# Patient Record
Sex: Male | Born: 1950 | Race: White | Hispanic: No | Marital: Married | Smoking: Former smoker
Health system: Southern US, Community
[De-identification: ages and names within clinical notes are randomized; demographics above are authoritative.]

## PROBLEM LIST (undated history)

## (undated) DIAGNOSIS — I10 Essential (primary) hypertension: Secondary | ICD-10-CM

## (undated) DIAGNOSIS — R3915 Urgency of urination: Secondary | ICD-10-CM

## (undated) DIAGNOSIS — Z9889 Other specified postprocedural states: Secondary | ICD-10-CM

## (undated) DIAGNOSIS — R112 Nausea with vomiting, unspecified: Secondary | ICD-10-CM

## (undated) DIAGNOSIS — N400 Enlarged prostate without lower urinary tract symptoms: Secondary | ICD-10-CM

## (undated) DIAGNOSIS — Z87442 Personal history of urinary calculi: Secondary | ICD-10-CM

## (undated) DIAGNOSIS — M1990 Unspecified osteoarthritis, unspecified site: Secondary | ICD-10-CM

## (undated) DIAGNOSIS — M5126 Other intervertebral disc displacement, lumbar region: Secondary | ICD-10-CM

## (undated) DIAGNOSIS — Z85828 Personal history of other malignant neoplasm of skin: Secondary | ICD-10-CM

## (undated) DIAGNOSIS — I770 Arteriovenous fistula, acquired: Secondary | ICD-10-CM

## (undated) DIAGNOSIS — C801 Malignant (primary) neoplasm, unspecified: Secondary | ICD-10-CM

## (undated) DIAGNOSIS — R002 Palpitations: Secondary | ICD-10-CM

## (undated) DIAGNOSIS — D126 Benign neoplasm of colon, unspecified: Secondary | ICD-10-CM

## (undated) DIAGNOSIS — R011 Cardiac murmur, unspecified: Secondary | ICD-10-CM

## (undated) HISTORY — PX: COLONOSCOPY W/ BIOPSIES: SHX1374

## (undated) HISTORY — PX: PROSTATECTOMY: SHX69

## (undated) HISTORY — DX: Malignant (primary) neoplasm, unspecified: C80.1

## (undated) HISTORY — DX: Arteriovenous fistula, acquired: I77.0

## (undated) HISTORY — DX: Essential (primary) hypertension: I10

## (undated) HISTORY — PX: OTHER SURGICAL HISTORY: SHX169

## (undated) HISTORY — DX: Benign neoplasm of colon, unspecified: D12.6

## (undated) HISTORY — DX: Other intervertebral disc displacement, lumbar region: M51.26

## (undated) HISTORY — DX: Benign prostatic hyperplasia without lower urinary tract symptoms: N40.0

## (undated) HISTORY — PX: POLYPECTOMY: SHX149

---

## 1998-10-05 ENCOUNTER — Encounter: Payer: Self-pay | Admitting: Emergency Medicine

## 1998-10-05 ENCOUNTER — Emergency Department (HOSPITAL_COMMUNITY): Admission: EM | Admit: 1998-10-05 | Discharge: 1998-10-05 | Payer: Self-pay | Admitting: Emergency Medicine

## 2003-05-12 ENCOUNTER — Encounter: Payer: Self-pay | Admitting: Vascular Surgery

## 2003-05-12 ENCOUNTER — Ambulatory Visit (HOSPITAL_COMMUNITY): Admission: RE | Admit: 2003-05-12 | Discharge: 2003-05-12 | Payer: Self-pay | Admitting: Vascular Surgery

## 2003-05-17 ENCOUNTER — Ambulatory Visit (HOSPITAL_COMMUNITY): Admission: RE | Admit: 2003-05-17 | Discharge: 2003-05-17 | Payer: Self-pay | Admitting: Vascular Surgery

## 2003-05-19 DIAGNOSIS — I770 Arteriovenous fistula, acquired: Secondary | ICD-10-CM

## 2003-05-19 HISTORY — DX: Arteriovenous fistula, acquired: I77.0

## 2003-06-15 ENCOUNTER — Ambulatory Visit (HOSPITAL_COMMUNITY): Admission: RE | Admit: 2003-06-15 | Discharge: 2003-06-16 | Payer: Self-pay | Admitting: Interventional Radiology

## 2003-06-19 ENCOUNTER — Emergency Department (HOSPITAL_COMMUNITY): Admission: EM | Admit: 2003-06-19 | Discharge: 2003-06-19 | Payer: Self-pay | Admitting: Emergency Medicine

## 2003-07-04 ENCOUNTER — Ambulatory Visit (HOSPITAL_COMMUNITY): Admission: RE | Admit: 2003-07-04 | Discharge: 2003-07-04 | Payer: Self-pay | Admitting: Vascular Surgery

## 2003-08-19 HISTORY — PX: AV FISTULA REPAIR: SHX563

## 2003-10-16 ENCOUNTER — Ambulatory Visit (HOSPITAL_COMMUNITY): Admission: RE | Admit: 2003-10-16 | Discharge: 2003-10-16 | Payer: Self-pay | Admitting: Interventional Radiology

## 2004-06-04 ENCOUNTER — Ambulatory Visit (HOSPITAL_COMMUNITY): Admission: RE | Admit: 2004-06-04 | Discharge: 2004-06-04 | Payer: Self-pay | Admitting: Interventional Radiology

## 2004-06-06 ENCOUNTER — Ambulatory Visit (HOSPITAL_COMMUNITY): Admission: RE | Admit: 2004-06-06 | Discharge: 2004-06-06 | Payer: Self-pay | Admitting: Interventional Radiology

## 2005-08-01 ENCOUNTER — Encounter: Admission: RE | Admit: 2005-08-01 | Discharge: 2005-08-01 | Payer: Self-pay | Admitting: Vascular Surgery

## 2005-08-29 ENCOUNTER — Ambulatory Visit (HOSPITAL_COMMUNITY): Admission: RE | Admit: 2005-08-29 | Discharge: 2005-08-29 | Payer: Self-pay | Admitting: Interventional Radiology

## 2007-08-05 ENCOUNTER — Encounter: Admission: RE | Admit: 2007-08-05 | Discharge: 2007-08-05 | Payer: Self-pay | Admitting: Vascular Surgery

## 2007-08-06 ENCOUNTER — Ambulatory Visit: Payer: Self-pay | Admitting: Vascular Surgery

## 2010-09-07 ENCOUNTER — Encounter: Payer: Self-pay | Admitting: Vascular Surgery

## 2010-12-31 NOTE — Assessment & Plan Note (Signed)
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  This was incidentally  discovered at the time of a femoral arterial injection for a  percutaneous closure device at the time of arteriogram.  This was in  2004 and he subsequently had MRI of his pelvis in 2004, 2006, and today.  The patient continues to be totally asymptomatic with this incidental  finding.  He specifically has no congestive heart failure symptoms.  He  had no leg swelling.  He does have slight telangiectasia in both legs  with no evidence of varicosities or leg swelling.  He otherwise is in  excellent health.   PHYSICAL EXAM:  I do not appreciate an abdominal bruit.  He has 2+  femoral pulses bilaterally and does not have any swelling in his right  versus left leg.   I reviewed his MRI with him today.  This shows no change.  Maximal  diameter of approximately 5-1/2 to 6 cm with communication between the  right internal carotid artery and veins.  I have again discussed this  with the patient and his wife present.  I explained I am comfortable  with no further followup since we have imaged this on 3 occasions now  with no change.  I explained symptoms of progressive problems with AV  malformations, which I explained would be extremely unlikely.  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  He is comfortable with this and will be seen on an as needed  basis.   Larina Earthly, M.D.  Electronically Signed   TFE/MEDQ  D:  08/06/2007  T:  08/09/2007  Job:  [id number]   cc:   Gloriajean Dell. Andrey Campanile, M.D.

## 2014-08-03 ENCOUNTER — Encounter: Payer: Self-pay | Admitting: Internal Medicine

## 2014-09-12 ENCOUNTER — Ambulatory Visit (AMBULATORY_SURGERY_CENTER): Payer: Self-pay

## 2014-09-12 VITALS — Ht 69.0 in | Wt 186.6 lb

## 2014-09-12 DIAGNOSIS — Z8371 Family history of colonic polyps: Secondary | ICD-10-CM

## 2014-09-12 MED ORDER — MOVIPREP 100 G PO SOLR: ORAL | Status: DC

## 2014-09-12 NOTE — Progress Notes (Signed)
Per pt, no allergies to soy or egg products.Pt not taking any weight loss meds or using  O2 at home. 

## 2014-09-22 ENCOUNTER — Encounter: Payer: Self-pay | Admitting: Internal Medicine

## 2014-09-22 ENCOUNTER — Other Ambulatory Visit: Payer: Self-pay | Admitting: *Deleted

## 2014-09-22 ENCOUNTER — Ambulatory Visit (AMBULATORY_SURGERY_CENTER): Payer: 59 | Admitting: Internal Medicine

## 2014-09-22 ENCOUNTER — Ambulatory Visit (INDEPENDENT_AMBULATORY_CARE_PROVIDER_SITE_OTHER)
Admission: RE | Admit: 2014-09-22 | Discharge: 2014-09-22 | Disposition: A | Discharge status: Home / Self Care | Payer: 59 | Source: Ambulatory Visit | Attending: Internal Medicine | Admitting: Internal Medicine

## 2014-09-22 VITALS — BP 135/88 | HR 67 | Temp 98.1°F | Resp 16 | Ht 69.0 in | Wt 186.0 lb

## 2014-09-22 DIAGNOSIS — Z1211 Encounter for screening for malignant neoplasm of colon: Secondary | ICD-10-CM

## 2014-09-22 DIAGNOSIS — Z83719 Family history of colon polyps, unspecified: Secondary | ICD-10-CM

## 2014-09-22 DIAGNOSIS — D120 Benign neoplasm of cecum: Secondary | ICD-10-CM

## 2014-09-22 DIAGNOSIS — D122 Benign neoplasm of ascending colon: Secondary | ICD-10-CM

## 2014-09-22 DIAGNOSIS — Z8371 Family history of colonic polyps: Secondary | ICD-10-CM

## 2014-09-22 HISTORY — PX: COLONOSCOPY: SHX174

## 2014-09-22 MED ORDER — SODIUM CHLORIDE 0.9 % IV SOLN: 500.0000 mL | INTRAVENOUS | Status: DC

## 2014-09-22 NOTE — Patient Instructions (Signed)
Discharge instructions given. Handouts on polyps and diverticulosis given. KUB ordered for today.return office visit in a few weeks office will schedule. Resume previous medications. YOU HAD AN ENDOSCOPIC PROCEDURE TODAY AT [facility]: Refer to the procedure report that was given to you for any specific questions about what was found during the examination.  If the procedure report does not answer your questions, please call your gastroenterologist to clarify.  If you requested that your care partner not be given the details of your procedure findings, then the procedure report has been included in a sealed envelope for you to review at your convenience later.  YOU SHOULD EXPECT: Some feelings of bloating in the abdomen. Passage of more gas than usual.  Walking can help get rid of the air that was put into your GI tract during the procedure and reduce the bloating. If you had a lower endoscopy (such as a colonoscopy or flexible sigmoidoscopy) you may notice spotting of blood in your stool or on the toilet paper. If you underwent a bowel prep for your procedure, then you may not have a normal bowel movement for a few days.  DIET: Your first meal following the procedure should be a light meal and then it is ok to progress to your normal diet.  A half-sandwich or bowl of soup is an example of a good first meal.  Heavy or fried foods are harder to digest and may make you feel nauseous or bloated.  Likewise meals heavy in dairy and vegetables can cause extra gas to form and this can also increase the bloating.  Drink plenty of fluids but you should avoid alcoholic beverages for 24 hours.  ACTIVITY: Your care partner should take you home directly after the procedure.  You should plan to take it easy, moving slowly for the rest of the day.  You can resume normal activity the day after the procedure however you should NOT DRIVE or use heavy machinery for 24 hours (because of the sedation  medicines used during the test).    SYMPTOMS TO REPORT IMMEDIATELY: A gastroenterologist can be reached at any hour.  During normal business hours, 8:30 AM to 5:00 PM Monday through Friday, call [phone].  After hours and on weekends, please call the GI answering service at [phone] who will take a message and have the physician on call contact you.   Following lower endoscopy (colonoscopy or flexible sigmoidoscopy):  Excessive amounts of blood in the stool  Significant tenderness or worsening of abdominal pains  Swelling of the abdomen that is new, acute  Fever of 100F or higher FOLLOW UP: If any biopsies were taken you will be contacted by phone or by letter within the next 1-3 weeks.  Call your gastroenterologist if you have not heard about the biopsies in 3 weeks.  Our staff will call the home number listed on your records the next business day following your procedure to check on you and address any questions or concerns that you may have at that time regarding the information given to you following your procedure. This is a courtesy call and so if there is no answer at the home number and we have not heard from you through the emergency physician on call, we will assume that you have returned to your regular daily activities without incident.  SIGNATURES/CONFIDENTIALITY: You and/or your care partner have signed paperwork which will be entered into your electronic medical record.  These signatures attest to the fact  that that the information above on your After Visit Summary has been reviewed and is understood.  Full responsibility of the confidentiality of this discharge information lies with you and/or your care-partner.

## 2014-09-22 NOTE — Progress Notes (Signed)
Called to room to assist during endoscopic procedure.  Patient ID and intended procedure confirmed with present staff. Received instructions for my participation in the procedure from the performing physician.  

## 2014-09-22 NOTE — Progress Notes (Signed)
Report to PACU, RN, vss, BBS= Clear.  

## 2014-09-22 NOTE — Op Note (Addendum)
[place]  [address]. [place], [address]   COLONOSCOPY PROCEDURE REPORT  PATIENT: Gregory Castaneda, Gregory Castaneda  MR#: [id number] BIRTHDATE: 1951/07/31 , 19  yrs. old GENDER: male ENDOSCOPIST: Lafayette Dragon, MD REFERRED TZ:[facility] Redmond Pulling, MD. PROCEDURE DATE:  09/22/2014 PROCEDURE:   Colonoscopy with snare polypectomy, Submucosal injection, any substance, and Colonoscopy with biopsy First Screening Colonoscopy - Avg.  risk and is 50 yrs.  old or older Yes.  Prior Negative Screening - Now for repeat screening. N/A  History of Adenoma - Now for follow-up colonoscopy & has been > or = to 3 yrs.  N/A  Polyps Removed Today? Yes. ASA CLASS:   Class I INDICATIONS:average risk patient for colon cancer and mother had colon polyps. MEDICATIONS: Monitored anesthesia care and Propofol 300 mg IV  DESCRIPTION OF PROCEDURE:   After the risks benefits and alternatives of the procedure were thoroughly explained, informed consent was obtained.  The digital rectal exam revealed no abnormalities of the rectum.   The [id number]  endoscope was introduced through the anus and advanced to the cecum, which was identified by both the appendix and ileocecal valve. No adverse events experienced.   The quality of the prep was good, using MoviPrep  The instrument was then slowly withdrawn as the colon was fully examined.      COLON FINDINGS: Four polypoid shaped sessile polyps measuring from 9 mm to 23 mm in size were found at the cecum x1 ( 9 mm) and ascending colon at 80 cm x3 , the largest polyp at 80 cm  measured 23 mm and it was carpeted and spread out and could not be removed entirely. Multiple biopsies were obtained. Polyp was injected with SPOT tattoo and marked with  surgical clips for location. The polyp appeared to very soft and  was not ulcerated  The remaining polyps in ascending colon polypectomy was performed with a cold snare. The resection was complete, the polyp tissue  was completely retrieved and sent to histology.  Multiple biopsies were performed using cold forceps of the remaining 2 polyps. There were  scattered diverticuli throughout the entire colon .  Retroflexed views revealed no abnormalities. The time to cecum=3 minutes 02 seconds. Withdrawal time=25 minutes 19 seconds.  The scope was withdrawn and the procedure completed. COMPLICATIONS: There were no immediate complications.  ENDOSCOPIC IMPRESSION: Four sessile polyps were found at the cecum x1 and  asscending colon at 80 cmx3,  polypectomy was performed with a cold snare; multiple biopsies of the large carpeted polyp at 80 cm were performed using cold forceps ,the same polyp was tattooed with SPOT  and marked with 2 endoclips, it could not be removed entirely  due to carpeted nature  Mild diverticulosis of entire colon  RECOMMENDATIONS: 1.  Await pathology results 2.  KUB today for location of the endoclips Depending on the pathology we  will consider laparoscopic assisted resection of the carpeted polyp  eSigned:  Lafayette Dragon, MD 10/10/2014 8:22 PM Revised: 10/10/2014 8:22 PM  cc:   PATIENT NAME:  Gregory Castaneda, Gregory Castaneda MR#: [id number]

## 2014-09-25 ENCOUNTER — Telehealth: Payer: Self-pay

## 2014-09-25 NOTE — Telephone Encounter (Signed)
Left message on answering machine. 

## 2014-09-26 ENCOUNTER — Encounter: Payer: Self-pay | Admitting: Internal Medicine

## 2014-10-03 ENCOUNTER — Ambulatory Visit: Payer: 59 | Admitting: Internal Medicine

## 2014-10-04 ENCOUNTER — Encounter: Payer: Self-pay | Admitting: Internal Medicine

## 2014-10-04 NOTE — Progress Notes (Signed)
Patient ID: Gregory Castaneda, male   DOB: [birth year], 64 y.o.   MRN: [id number] The patient's chart has been reviewed by Dr. Olevia Perches  and the recommendations are noted below.      Follow-up advised. Schedule patient for next available appointment. Outcome of communication with the patient: letter mailed

## 2014-10-11 ENCOUNTER — Encounter: Payer: Self-pay | Admitting: Internal Medicine

## 2014-10-11 ENCOUNTER — Ambulatory Visit (INDEPENDENT_AMBULATORY_CARE_PROVIDER_SITE_OTHER): Payer: 59 | Admitting: Internal Medicine

## 2014-10-11 VITALS — BP 150/70 | HR 84 | Ht 66.75 in | Wt 190.4 lb

## 2014-10-11 DIAGNOSIS — D122 Benign neoplasm of ascending colon: Secondary | ICD-10-CM

## 2014-10-11 NOTE — Progress Notes (Signed)
Gregory Castaneda [birth year] [id number]  Note: This dictation was prepared with Dragon digital system. Any transcriptional errors that result from this procedure are unintentional.   History of Present Illness: This is a 64 year old white male who just underwent his first screening colonoscopy on 09/22/2058 year with findings of four polyps.. One polyp at the hepatic flexure was carpeted, large, measuring 2-3 cm in diameter and could not be completely snared because of the of spread out nature. Pathology report showed it to be adenomatous but there was no dysplasia. There was also a cecal polyp which was removed entirely and 2 additional ascending colon polyps which were removed entirely. The post-polypectomy site was tattooed as well as marked with an Endo Clip. KUB was obtained after colonoscopy to document placement of the clip which confirmed to be in hepatic flexure. Patient and his wife came today to discuss future disposition concerning the polyp. Patient's medical history is significant for  embolization of a cerebral av fistula  by Dr. Estanislado Pandy  In 2004. He was also at that time told that he may have a fistula in the right femoral artery. Otherwise he has been in excellent health    Past Medical History  Diagnosis Date  . Lumbar herniated disc   . Hypertension   . Enlarged prostate   . 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 Postoperative nausea and vomiting   . Adenomatous colon polyp     tubular    Past Surgical History  Procedure Laterality Date  . Av fistula repair    . Moles removed      from face    Allergies  Allergen Reactions  . Penicillins     Rash, feet itching, swelling of mouth    Family history and social history have been reviewed.  Review of Systems: Denies abdominal pain weight loss rectal bleeding or change in bowel habits.  The remainder of the 10 point ROS is negative except as outlined in the H&P  Physical Exam: General Appearance Well developed, in  no distress Eyes  Non icteric  HEENT  Non traumatic, normocephalic  Mouth No lesion, tongue papillated, no cheilosis Neck Supple without adenopathy, thyroid not enlarged, no carotid bruits, no JVD Lungs Clear to auscultation bilaterally COR Normal S1, normal S2, regular rhythm, no murmur, quiet precordium Abdomen soft nontender with normoactive bowel sounds. No palpable mass. There is a bruit in the right femoral artery Rectal not done Extremities  No pedal edema Skin No lesions Neurological Alert and oriented x 3 Psychological Normal mood and affect  Assessment and Plan:   64 year old white male with the large carpeted polyp in hepatic flexure of his colon documented on recent colonoscopy. Pathology shows it to be adenoma. There was no dysplasia but not an entire polyp was removed therefore, we are  not completely sure. I have suggested to the patient to undergo a laparoscopically assisted segmental colon resection to prevent future colonoscopies and risk of developing colon cancer. He goes along with that and we will set up appointment for him to see Dr. Johney Maine for consultation

## 2014-10-11 NOTE — Patient Instructions (Addendum)
You have been scheduled for an appointment with Dr. Johney Maine at [facility]. Your appointment is on 10/26/14 at 8:45am. Please arrive at 8:15am for registration. Make certain to bring a list of current medications, including any over the counter medications or vitamins. Also bring your co-pay if you have one as well as your insurance cards. [facility] is located at [address]. Should you need to reschedule your appointment, please contact them at [phone].    cc: Dr Graylon Boston, Dr Kathryne Eriksson

## 2014-10-26 ENCOUNTER — Other Ambulatory Visit (INDEPENDENT_AMBULATORY_CARE_PROVIDER_SITE_OTHER): Payer: Self-pay | Admitting: Surgery

## 2014-10-26 NOTE — H&P (Signed)
Gregory Castaneda 10/26/2014 8:40 AM Location: [facility] Patient #: [id number] DOB: November 02, 1950 Married / Language: [name] / Race: White Male History of Present Illness [name] MD; 10/26/2014 9:44 AM) Patient words: colon polyp.  The patient is a 64 year old male who presents with a colonic polyp. Pleasant active male sent by his gastroenterologist, Dr. Delfin Edis, for concern of large hepatic flexure sessile serrated adenomatous polyp. Active male. Former Production designer, theatre/television/film. Mother had history of colon polyps. Underwent screening colonoscopy. Numerous small adenomatous polyps removed. Larger sessile serrated adenomatous polyp found at hepatic flexure of the colon by endoscopy as well as by plain film. Patient normally has one bowel movement a day. Rather physically active. No history of Crohn's or ulcerative colitis. No inflammatory bowel disease. No irritable bowel syndrome. Mild urinary symptoms controlled with Flomax. He's never had abdominal surgeries. Patient concern of large polyp not able to be removed endoscopically, surgical consultation requested. Patient also has a history of arteriovenous malformations. Had one glued shut by endovascular approach. Has a another one near his RIGHT femoral vein that is stable by MRI for the past few years. No problems with walking. No problems with hearing or headaches. He does not hear a thrill in his right ear anymore as he did before Other Problems Marjean Donna, CMA; 10/26/2014 8:40 AM) Back Pain Diverticulosis High blood pressure Other disease, cancer, significant illness  Past Surgical History Marjean Donna, [place]; 10/26/2014 8:40 AM) Colon Polyp Removal - Colonoscopy  Diagnostic Studies History Marjean Donna, CMA; 10/26/2014 8:40 AM) Colonoscopy within last year  Allergies Davy Pique Bynum, CMA; 10/26/2014 8:42 AM) Penicillamine *ASSORTED CLASSES*  Medication History (Sonya Bynum, CMA; 10/26/2014 8:42  AM) Hydrochlorothiazide (25MG  Tablet, Oral) Active. Tamsulosin HCl (0.4MG  Capsule, Oral) Active. Diazepam (5MG  Tablet, Oral) Active. Cyclobenzaprine HCl (10MG  Tablet, Oral) Active. Medications Reconciled  Social History Marjean Donna, CMA; 10/26/2014 8:40 AM) Alcohol use Moderate alcohol use. Caffeine use Coffee. No drug use Tobacco use Former smoker.  Family History Marjean Donna, CMA; 10/26/2014 8:40 AM) Bleeding disorder Mother. Cancer Father. Cerebrovascular Accident Mother. Colon Polyps Mother. Hypertension Mother, Sister. Melanoma Mother, Sister. Migraine Headache Sister. Respiratory Condition Father. Thyroid problems Mother, Sister.     Review of Systems Davy Pique Bynum CMA; 10/26/2014 8:40 AM) General Not Present- Appetite Loss, Chills, Fatigue, Fever, Night Sweats, Weight Gain and Weight Loss. Skin Not Present- Change in Wart/Mole, Dryness, Hives, Jaundice, New Lesions, Non-Healing Wounds, Rash and Ulcer. HEENT Present- Wears glasses/contact lenses. Not Present- Earache, Hearing Loss, Hoarseness, Nose Bleed, Oral Ulcers, Ringing in the Ears, Seasonal Allergies, Sinus Pain, Sore Throat, Visual Disturbances and Yellow Eyes. Respiratory Not Present- Bloody sputum, Chronic Cough, Difficulty Breathing, Snoring and Wheezing. Breast Not Present- Breast Mass, Breast Pain, Nipple Discharge and Skin Changes. Cardiovascular Not Present- Chest Pain, Difficulty Breathing Lying Down, Leg Cramps, Palpitations, Rapid Heart Rate, Shortness of Breath and Swelling of Extremities. Gastrointestinal Not Present- Abdominal Pain, Bloating, Bloody Stool, Change in Bowel Habits, Chronic diarrhea, Constipation, Difficulty Swallowing, Excessive gas, Gets full quickly at meals, Hemorrhoids, Indigestion, Nausea, Rectal Pain and Vomiting. Male Genitourinary Present- Change in Urinary Stream. Not Present- Blood in Urine, Frequency, Impotence, Nocturia, Painful Urination, Urgency and Urine  Leakage. Musculoskeletal Present- Back Pain. Not Present- Joint Pain, Joint Stiffness, Muscle Pain, Muscle Weakness and Swelling of Extremities. Neurological Not Present- Decreased Memory, Fainting, Headaches, Numbness, Seizures, Tingling, Tremor, Trouble walking and Weakness. Psychiatric Not Present- Anxiety, Bipolar, Change in Sleep Pattern, Depression, Fearful and Frequent crying. Endocrine Not Present- Cold  Intolerance, Excessive Hunger, Hair Changes, Heat Intolerance, Hot flashes and New Diabetes. Hematology Present- Easy Bruising. Not Present- Excessive bleeding, Gland problems, HIV and Persistent Infections.  Vitals (Sonya Bynum CMA; 10/26/2014 8:41 AM) 10/26/2014 8:41 AM Weight: 187 lb Height: 67in Body Surface Area: 2 m Body Mass Index: 29.29 kg/m Temp.: 97.31F(Temporal)  Pulse: 77 (Regular)  BP: 126/74 (Sitting, Left Arm, Standard)     Physical Exam [name] MD; 10/26/2014 9:36 AM)  General Mental Status-Alert. General Appearance-Not in acute distress, Not Sickly. Orientation-Oriented X3. Hydration-Well hydrated. Voice-Normal.  Integumentary Global Assessment Upon inspection and palpation of skin surfaces of the - Axillae: non-tender, no inflammation or ulceration, no drainage. and Distribution of scalp and body hair is normal. General Characteristics Temperature - normal warmth is noted.  Head and Neck Head-normocephalic, atraumatic with no lesions or palpable masses. Face Global Assessment - atraumatic, no absence of expression. Neck Global Assessment - no abnormal movements, no bruit auscultated on the right, no bruit auscultated on the left, no decreased range of motion, non-tender. Trachea-midline. Thyroid Gland Characteristics - non-tender.  Eye Eyeball - Left-Extraocular movements intact, No Nystagmus. Eyeball - Right-Extraocular movements intact, No Nystagmus. Cornea - Left-No Hazy. Cornea - Right-No  Hazy. Sclera/Conjunctiva - Left-No scleral icterus, No Discharge. Sclera/Conjunctiva - Right-No scleral icterus, No Discharge. Pupil - Left-Direct reaction to light normal. Pupil - Right-Direct reaction to light normal.  ENMT Ears Pinna - Left - no drainage observed, no generalized tenderness observed. Right - no drainage observed, no generalized tenderness observed. Nose and Sinuses External Inspection of the Nose - no destructive lesion observed. Inspection of the nares - Left - quiet respiration. Right - quiet respiration. Mouth and Throat Lips - Upper Lip - no fissures observed, no pallor noted. Lower Lip - no fissures observed, no pallor noted. Nasopharynx - no discharge present. Oral Cavity/Oropharynx - Tongue - no dryness observed. Oral Mucosa - no cyanosis observed. Hypopharynx - no evidence of airway distress observed.  Chest and Lung Exam Inspection Movements - Normal and Symmetrical. Accessory muscles - No use of accessory muscles in breathing. Palpation Palpation of the chest reveals - Non-tender. Auscultation Breath sounds - Normal and Clear.  Cardiovascular Auscultation Rhythm - Regular. Murmurs & Other Heart Sounds - Auscultation of the heart reveals - No Murmurs and No Systolic Clicks.  Abdomen Inspection Inspection of the abdomen reveals - No Visible peristalsis and No Abnormal pulsations. Umbilicus - No Bleeding, No Urine drainage. Palpation/Percussion Palpation and Percussion of the abdomen reveal - Soft, Non Tender, No Rebound tenderness, No Rigidity (guarding) and No Cutaneous hyperesthesia. Note: Soft and nontender nondistended. Moderate diastases recti. No umbilical hernia.   Male Genitourinary Sexual Maturity Tanner 5 - Adult hair pattern and Adult penile size and shape. Note: Normal external male genitalia. No inguinal hernias.   Peripheral Vascular Upper Extremity Inspection - Left - No Cyanotic nailbeds, Not Ischemic. Right - No Cyanotic  nailbeds, Not Ischemic.  Neurologic Neurologic evaluation reveals -normal attention span and ability to concentrate, able to name objects and repeat phrases. Appropriate fund of knowledge , normal sensation and normal coordination. Mental Status Affect - not angry, not paranoid. Cranial Nerves-Normal Bilaterally. Gait-Normal.  Neuropsychiatric Mental status exam performed with findings of-able to articulate well with normal speech/language, rate, volume and coherence, thought content normal with ability to perform basic computations and apply abstract reasoning and no evidence of hallucinations, delusions, obsessions or homicidal/suicidal ideation.  Musculoskeletal Global Assessment Spine, Ribs and Pelvis - no instability, subluxation or laxity. Right  Upper Extremity - no instability, subluxation or laxity.  Lymphatic Head & Neck  General Head & Neck Lymphatics: Bilateral - Description - No Localized lymphadenopathy. Axillary  General Axillary Region: Bilateral - Description - No Localized lymphadenopathy. Femoral & Inguinal  Generalized Femoral & Inguinal Lymphatics: Left - Description - No Localized lymphadenopathy. Right - Description - No Localized lymphadenopathy.    Assessment & Plan [name] MD; 10/26/2014 9:35 AM)  SESSILE COLONIC POLYP (211.3  K63.5) Impression: Large sessile serrated polyp of ascending colon. Adenomatous changes but no high-grade dysplasia. I gave copy of his colonoscopy report and pathology report.  I do not think this is a great location for endoscopic mucosal resection. Would benefit from segmental colonic resection. Good candidate for minimally invasive approach. Would plan robotic intracorporeal resection and anastomosis. He is interested in proceeding. I discussed with him.  Current Plans Schedule for Surgery Discussed regular exercise with patient.  The anatomy & physiology of the digestive tract was discussed. The  pathophysiology of the colon was discussed. Natural history risks without surgery was discussed. I feel the risks of no intervention will lead to serious problems that outweigh the operative risks; therefore, I recommended a partial colectomy to remove the pathology. Minimally invasive (Robotic/Laparoscopic) & open techniques were discussed.  Risks such as bleeding, infection, abscess, leak, reoperation, possible ostomy, hernia, heart attack, death, and other risks were discussed. I noted a good likelihood this will help address the problem. Goals of post-operative recovery were discussed as well. Need for adequate nutrition, daily bowel regimen and healthy physical activity, to optimize recovery was noted as well. We will work to minimize complications. Educational materials were available as well. Questions were answered. The patient expresses understanding & wishes to proceed with surgery.  Pt Education - [facility] Colon Bowel Prep 2015 Miralax/Antibiotics Started Neomycin Sulfate 500MG , 2 (two) Tablet SEE NOTE, #6, 10/26/2014, No Refill. Local Order: TAKE TWO TABLETS AT 2 PM, 3 PM, AND 10 PM THE DAY PRIOR TO SURGERY Started Flagyl 500MG , 2 (two) Tablet SEE NOTE, #6, 10/26/2014, No Refill. Local Order: Take at 2pm, 3pm, and 10pm the day prior to your colon operation Pt Education - [facility] (Yanira Tolsma) Pt Education - [facility] Abdominal Surgery (Royann Wildasin) Pt Education - [facility] (Charlesetta Milliron)  [name], M.D., F.A.C.S. Gastrointestinal and Minimally Invasive Surgery [facility] [address], [place],  [address] [phone] Main / Paging

## 2014-10-27 NOTE — Progress Notes (Signed)
Richardson Landry, Thanks for seeing this patient. Dora brodie

## 2014-11-17 ENCOUNTER — Encounter (HOSPITAL_COMMUNITY)
Admission: RE | Admit: 2014-11-17 | Discharge: 2014-11-17 | Disposition: A | Discharge status: Home / Self Care | Payer: 59 | Source: Ambulatory Visit | Attending: Surgery | Admitting: Surgery

## 2014-11-17 ENCOUNTER — Encounter (HOSPITAL_COMMUNITY): Payer: Self-pay

## 2014-11-17 ENCOUNTER — Other Ambulatory Visit: Payer: Self-pay

## 2014-11-17 DIAGNOSIS — Z01812 Encounter for preprocedural laboratory examination: Secondary | ICD-10-CM | POA: Insufficient documentation

## 2014-11-17 DIAGNOSIS — Z01810 Encounter for preprocedural cardiovascular examination: Secondary | ICD-10-CM | POA: Diagnosis not present

## 2014-11-17 HISTORY — DX: Other specified postprocedural states: Z98.890

## 2014-11-17 HISTORY — DX: Personal history of urinary calculi: Z87.442

## 2014-11-17 HISTORY — DX: Other specified postprocedural states: R11.2

## 2014-11-17 LAB — BASIC METABOLIC PANEL
Anion gap: 11 (ref 5–15)
BUN: 15 mg/dL (ref 6–23)
CALCIUM: 9.9 mg/dL (ref 8.4–10.5)
CO2: 27 mmol/L (ref 19–32)
Chloride: 104 mmol/L (ref 96–112)
Creatinine, Ser: 1.18 mg/dL (ref 0.50–1.35)
GFR calc Af Amer: 74 mL/min — ABNORMAL LOW (ref >90)
GFR, EST NON AFRICAN AMERICAN: 63 mL/min — AB (ref >90)
GLUCOSE: 114 mg/dL — AB (ref 70–99)
POTASSIUM: 3.6 mmol/L (ref 3.5–5.1)
SODIUM: 142 mmol/L (ref 135–145)

## 2014-11-17 LAB — CBC
HCT: 45 % (ref 39.0–52.0)
Hemoglobin: 15 g/dL (ref 13.0–17.0)
MCH: 30.7 pg (ref 26.0–34.0)
MCHC: 33.3 g/dL (ref 30.0–36.0)
MCV: 92.2 fL (ref 78.0–100.0)
PLATELETS: 194 10*3/uL (ref 150–400)
RBC: 4.88 MIL/uL (ref 4.22–5.81)
RDW: 12.5 % (ref 11.5–15.5)
WBC: 4.3 10*3/uL (ref 4.0–10.5)

## 2014-11-17 NOTE — Progress Notes (Signed)
   11/17/14 1432  OBSTRUCTIVE SLEEP APNEA  Have you ever been diagnosed with sleep apnea through a sleep study? No  Do you snore loudly (loud enough to be heard through closed doors)?  0  Do you often feel tired, fatigued, or sleepy during the daytime? 0  Has anyone observed you stop breathing during your sleep? 0  Do you have, or are you being treated for high blood pressure? 1  BMI more than 35 kg/m2? 0  Age over 64 years old? 1  Neck circumference greater than 40 cm/16 inches? 1  Gender: 1

## 2014-11-17 NOTE — Consult Note (Signed)
WOC ostomy consult note Patient seen for preoperative stoma site selection in the event one is needed intraoperatively.  Patient understands that this is not the operative plan, but should a stoma be required, it is important for it to be located in an ideal site.  Abdomen assessed in the sitting and standing positions. Soft, flaccid abdominal tissues noted, but I am able to palpate the rectus muscle.  Site selected is located in the LUQ, 5.5cm to the left of the umbilicus and 3cm above.  Stoma care will be more challenging is a stoma were to be in the LLQ.  Site is marked with a surgical marking pen but is not covered with a thin film dressing as the planned procedure is not until 11/28/14. Patient understands that there is a Best boy who will assist him with learning the care and management of an ostomy in the event one is created intraoperatively. [facility] nursing team will not follow, but will remain available to this patient, the nursing, surgical and medical teams.  Please re-consult if needed. Thanks, Maudie Flakes, MSN, RN, [place], [place], [place] [phone])

## 2014-11-17 NOTE — Patient Instructions (Addendum)
Your procedure is scheduled on:  11/28/14  TUESDAY  Report to [facility] at   1130    AM.   Call this number if you have problems the morning of surgery: [phone]     BOWEL PREP AS PER OFFICE MAY HAVE CLEAR LIQUIDS ONLY Tuesday MORNING UNTIL 0730am-  THEN NOTHING BY MOUTH    Take these medicines the morning of surgery with A SIP OF WATER:May take Tamulosin   .  Contacts, dentures or partial plates, or metal hairpins  can not be worn to surgery. Your family will be responsible for glasses, dentures, hearing aides while you are in surgery  Leave suitcase in the car. After surgery it may be brought to your room.  For patients admitted to the hospital, checkout time is 11:00 AM day of  discharge.         [facility] IS NOT RESPONSIBLE FOR ANY VALUABLES  Patients discharged the day of surgery will not be allowed to drive home. IF going home the day of surgery, you must have a driver and someone to stay with you for the first 24 hours                                                                  [facility] - Preparing for Surgery Before surgery, you can play an important role.  Because skin is not sterile, your skin needs to be as free of germs as possible.  You can reduce the number of germs on your skin by washing with CHG (chlorahexidine gluconate) soap before surgery.  CHG is an antiseptic cleaner which kills germs and bonds with the skin to continue killing germs even after washing. Please DO NOT use if you have an allergy to CHG or antibacterial soaps.  If your skin becomes reddened/irritated stop using the CHG and inform your nurse when you arrive at Short Stay. Do not shave (including legs and underarms) for at least 48 hours prior to the first CHG shower.  You may shave your face/neck. Please follow these instructions carefully:  1.  Shower with CHG Soap the night before surgery and the  morning of  Surgery.  2.  If you choose to wash your hair, wash your hair first as usual with your  normal  shampoo.  3.  After you shampoo, rinse your hair and body thoroughly to remove the  shampoo.                           4.  Use CHG as you would any other liquid soap.  You can apply chg directly  to the skin and wash                       Gently with a scrungie or clean washcloth.  5.  Apply the CHG Soap to your body ONLY FROM THE NECK DOWN.   Do not use on face/ open                           Wound or open sores. Avoid contact  with eyes, ears mouth and genitals (private parts).                       Wash face,  Genitals (private parts) with your normal soap.             6.  Wash thoroughly, paying special attention to the area where your surgery  will be performed.  7.  Thoroughly rinse your body with warm water from the neck down.  8.  DO NOT shower/wash with your normal soap after using and rinsing off  the CHG Soap.                9.  Pat yourself dry with a clean towel.            10.  Wear clean pajamas.            11.  Place clean sheets on your bed the night of your first shower and do not  sleep with pets. Day of Surgery : Do not apply any lotions/deodorants the morning of surgery.  Please wear clean clothes to the hospital/surgery center.  FAILURE TO FOLLOW THESE INSTRUCTIONS MAY RESULT IN THE CANCELLATION OF YOUR SURGERY PATIENT SIGNATURE_________________________________  NURSE SIGNATURE__________________________________  ________________________________________________________________________  WHAT IS A BLOOD TRANSFUSION? Blood Transfusion Information  A transfusion is the replacement of blood or some of its parts. Blood is made up of multiple cells which provide different functions.  Red blood cells carry oxygen and are used for blood loss replacement.  White blood cells fight against infection.  Platelets control bleeding.  Plasma helps clot blood.  Other blood products are  available for specialized needs, such as hemophilia or other clotting disorders. BEFORE THE TRANSFUSION  Who gives blood for transfusions?   Healthy volunteers who are fully evaluated to make sure their blood is safe. This is blood bank blood. Transfusion therapy is the safest it has ever been in the practice of medicine. Before blood is taken from a donor, a complete history is taken to make sure that person has no history of diseases nor engages in risky social behavior (examples are intravenous drug use or sexual activity with multiple partners). The donor's travel history is screened to minimize risk of transmitting infections, such as malaria. The donated blood is tested for signs of infectious diseases, such as HIV and hepatitis. The blood is then tested to be sure it is compatible with you in order to minimize the chance of a transfusion reaction. If you or a relative donates blood, this is often done in anticipation of surgery and is not appropriate for emergency situations. It takes many days to process the donated blood. RISKS AND COMPLICATIONS Although transfusion therapy is very safe and saves many lives, the main dangers of transfusion include:   Getting an infectious disease.  Developing a transfusion reaction. This is an allergic reaction to something in the blood you were given. Every precaution is taken to prevent this. The decision to have a blood transfusion has been considered carefully by your caregiver before blood is given. Blood is not given unless the benefits outweigh the risks. AFTER THE TRANSFUSION  Right after receiving a blood transfusion, you will usually feel much better and more energetic. This is especially true if your red blood cells have gotten low (anemic). The transfusion raises the level of the red blood cells which carry oxygen, and this usually causes an energy increase.  The nurse administering the transfusion will monitor  you carefully for  complications. HOME CARE INSTRUCTIONS  No special instructions are needed after a transfusion. You may find your energy is better. Speak with your caregiver about any limitations on activity for underlying diseases you may have. SEEK MEDICAL CARE IF:   Your condition is not improving after your transfusion.  You develop redness or irritation at the intravenous (IV) site. SEEK IMMEDIATE MEDICAL CARE IF:  Any of the following symptoms occur over the next 12 hours:  Shaking chills.  You have a temperature by mouth above 102 F (38.9 C), not controlled by medicine.  Chest, back, or muscle pain.  People around you feel you are not acting correctly or are confused.  Shortness of breath or difficulty breathing.  Dizziness and fainting.  You get a rash or develop hives.  You have a decrease in urine output.  Your urine turns a dark color or changes to pink, red, or brown. Any of the following symptoms occur over the next 10 days:  You have a temperature by mouth above 102 F (38.9 C), not controlled by medicine.  Shortness of breath.  Weakness after normal activity.  The white part of the eye turns yellow (jaundice).  You have a decrease in the amount of urine or are urinating less often.  Your urine turns a dark color or changes to pink, red, or brown. Document Released: 08/01/2000 Document Revised: 10/27/2011 Document Reviewed: 03/20/2008 [facility] Patient Information 2014 [place], [place].  _______________________________________________________________________

## 2014-11-17 NOTE — Progress Notes (Signed)
Seen by Darol Destine RN ostomy nurse

## 2014-11-20 LAB — HEMOGLOBIN A1C
HEMOGLOBIN A1C: 5.6 % (ref 4.8–5.6)
Mean Plasma Glucose: 114 mg/dL

## 2014-11-28 ENCOUNTER — Encounter (HOSPITAL_COMMUNITY): Admission: RE | Payer: Self-pay | Source: Ambulatory Visit

## 2014-11-28 ENCOUNTER — Inpatient Hospital Stay (HOSPITAL_COMMUNITY): Admission: RE | Admit: 2014-11-28 | Payer: 59 | Source: Ambulatory Visit | Admitting: Surgery

## 2014-11-28 SURGERY — COLECTOMY, PARTIAL, ROBOT-ASSISTED, LAPAROSCOPIC
Anesthesia: General

## 2015-02-14 NOTE — Progress Notes (Signed)
Please put orders in Epic surgery 02-27-15 pre op 02-23-15 Thanks 

## 2015-02-15 ENCOUNTER — Other Ambulatory Visit: Payer: Self-pay | Admitting: Surgery

## 2015-02-15 NOTE — H&P (Signed)
Gregory Castaneda 10/26/2014 8:40 AM Location: [facility] Patient #: [id number] DOB: [birth year] Married / Language: Gregory Castaneda / Race: White Male  History of Present Illness Gregory Hector Gregory Castaneda; 10/26/2014 9:44 AM) Patient words: colon polyp.  The patient is a 64 year old male who presents with a colonic polyp. Pleasant active male sent by his gastroenterologist, Dr. Delfin Edis, for concern of large hepatic flexure sessile serrated adenomatous polyp. Active male. Former Production designer, theatre/television/film. Mother had history of colon polyps. Underwent screening colonoscopy. Numerous small adenomatous polyps removed. Larger sessile serrated adenomatous polyp found at hepatic flexure of the colon by endoscopy as well as by plain film. Patient normally has one bowel movement a day. Rather physically active. No history of Crohn's or ulcerative colitis. No inflammatory bowel disease. No irritable bowel syndrome. Mild urinary symptoms controlled with Flomax. He's never had abdominal surgeries. Patient concern of large polyp not able to be removed endoscopically, surgical consultation requested. Patient also has a history of arteriovenous malformations. Had one glued shut by endovascular approach. Has a another one near his RIGHT femoral vein that is stable by MRI for the past few years. No problems with walking. No problems with hearing or headaches. He does not hear a thrill in his right ear anymore as he did before.  No new events.  Ready for surgery   Other Problems Gregory Castaneda, Gregory Castaneda; 10/26/2014 8:40 AM) Back Pain Diverticulosis High blood pressure Other disease, cancer, significant illness  Past Surgical History Gregory Castaneda, Gregory Castaneda; 10/26/2014 8:40 AM) Colon Polyp Removal - Colonoscopy  Diagnostic Studies History Gregory Castaneda, Gregory Castaneda; 10/26/2014 8:40 AM) Colonoscopy within last year  Allergies Gregory Castaneda, Gregory Castaneda; 10/26/2014 8:42 AM) Penicillamine *ASSORTED CLASSES*  Medication History  (Gregory Castaneda, Gregory Castaneda; 10/26/2014 8:42 AM) Hydrochlorothiazide (25MG  Tablet, Oral) Active. Tamsulosin HCl (0.4MG  Capsule, Oral) Active. Diazepam (5MG  Tablet, Oral) Active. Cyclobenzaprine HCl (10MG  Tablet, Oral) Active. Medications Reconciled  Social History Gregory Castaneda, Gregory Castaneda; 10/26/2014 8:40 AM) Alcohol use Moderate alcohol use. Caffeine use Coffee. No drug use Tobacco use Former smoker.  Family History Gregory Castaneda, Gregory Castaneda; 10/26/2014 8:40 AM) Bleeding disorder Mother. Cancer Father. Cerebrovascular Accident Mother. Colon Polyps Mother. Hypertension Mother, Sister. Melanoma Mother, Sister. Migraine Headache Sister. Respiratory Condition Father. Thyroid problems Mother, Sister.  Review of Systems Gregory Castaneda Gregory Castaneda; 10/26/2014 8:40 AM) General Not Present- Appetite Loss, Chills, Fatigue, Fever, Night Sweats, Weight Gain and Weight Loss. Skin Not Present- Change in Wart/Mole, Dryness, Hives, Jaundice, New Lesions, Non-Healing Wounds, Rash and Ulcer. HEENT Present- Wears glasses/contact lenses. Not Present- Earache, Hearing Loss, Hoarseness, Nose Bleed, Oral Ulcers, Ringing in the Ears, Seasonal Allergies, Sinus Pain, Sore Throat, Visual Disturbances and Yellow Eyes. Respiratory Not Present- Bloody sputum, Chronic Cough, Difficulty Breathing, Snoring and Wheezing. Breast Not Present- Breast Mass, Breast Pain, Nipple Discharge and Skin Changes. Cardiovascular Not Present- Chest Pain, Difficulty Breathing Lying Down, Leg Cramps, Palpitations, Rapid Heart Rate, Shortness of Breath and Swelling of Extremities. Gastrointestinal Not Present- Abdominal Pain, Bloating, Bloody Stool, Change in Bowel Habits, Chronic diarrhea, Constipation, Difficulty Swallowing, Excessive gas, Gets full quickly at meals, Hemorrhoids, Indigestion, Nausea, Rectal Pain and Vomiting. Male Genitourinary Present- Change in Urinary Stream. Not Present- Blood in Urine, Frequency, Impotence, Nocturia, Painful  Urination, Urgency and Urine Leakage. Musculoskeletal Present- Back Pain. Not Present- Joint Pain, Joint Stiffness, Muscle Pain, Muscle Weakness and Swelling of Extremities. Neurological Not Present- Decreased Memory, Fainting, Headaches, Numbness, Seizures, Tingling, Tremor, Trouble walking and Weakness. Psychiatric Not Present- Anxiety, Bipolar, Change in Sleep Pattern, Depression,  Fearful and Frequent crying. Endocrine Not Present- Cold Intolerance, Excessive Hunger, Hair Changes, Heat Intolerance, Hot flashes and New Diabetes. Hematology Present- Easy Bruising. Not Present- Excessive bleeding, Gland problems, HIV and Persistent Infections.   Vitals (Gregory Castaneda Gregory Castaneda; 10/26/2014 8:41 AM) 10/26/2014 8:41 AM Weight: 187 lb Height: 67in Body Surface Area: 2 m Body Mass Index: 29.29 kg/m Temp.: 97.79F(Temporal)  Pulse: 77 (Regular)  BP: 126/74 (Sitting, Left Arm, Standard)    Physical Exam Gregory Hector Gregory Castaneda; 10/26/2014 9:36 AM) General Mental Status-Alert. General Appearance-Not in acute distress, Not Sickly. Orientation-Oriented X3. Hydration-Well hydrated. Voice-Normal.  Integumentary Global Assessment Upon inspection and palpation of skin surfaces of the - Axillae: non-tender, no inflammation or ulceration, no drainage. and Distribution of scalp and body hair is normal. General Characteristics Temperature - normal warmth is noted.  Head and Neck Head-normocephalic, atraumatic with no lesions or palpable masses. Face Global Assessment - atraumatic, no absence of expression. Neck Global Assessment - no abnormal movements, no bruit auscultated on the right, no bruit auscultated on the left, no decreased range of motion, non-tender. Trachea-midline. Thyroid Gland Characteristics - non-tender.  Eye Eyeball - Left-Extraocular movements intact, No Nystagmus. Eyeball - Right-Extraocular movements intact, No Nystagmus. Cornea - Left-No  Hazy. Cornea - Right-No Hazy. Sclera/Conjunctiva - Left-No scleral icterus, No Discharge. Sclera/Conjunctiva - Right-No scleral icterus, No Discharge. Pupil - Left-Direct reaction to light normal. Pupil - Right-Direct reaction to light normal.  ENMT Ears Pinna - Left - no drainage observed, no generalized tenderness observed. Right - no drainage observed, no generalized tenderness observed. Nose and Sinuses External Inspection of the Nose - no destructive lesion observed. Inspection of the nares - Left - quiet respiration. Right - quiet respiration. Mouth and Throat Lips - Upper Lip - no fissures observed, no pallor noted. Lower Lip - no fissures observed, no pallor noted. Nasopharynx - no discharge present. Oral Cavity/Oropharynx - Tongue - no dryness observed. Oral Mucosa - no cyanosis observed. Hypopharynx - no evidence of airway distress observed.  Chest and Lung Exam Inspection Movements - Normal and Symmetrical. Accessory muscles - No use of accessory muscles in breathing. Palpation Palpation of the chest reveals - Non-tender. Auscultation Breath sounds - Normal and Clear.  Cardiovascular Auscultation Rhythm - Regular. Murmurs & Other Heart Sounds - Auscultation of the heart reveals - No Murmurs and No Systolic Clicks.  Abdomen Inspection Inspection of the abdomen reveals - No Visible peristalsis and No Abnormal pulsations. Umbilicus - No Bleeding, No Urine drainage. Palpation/Percussion Palpation and Percussion of the abdomen reveal - Soft, Non Tender, No Rebound tenderness, No Rigidity (guarding) and No Cutaneous hyperesthesia. Note: Soft and nontender nondistended. Moderate diastases recti. No umbilical hernia.   Male Genitourinary Sexual Maturity Tanner 5 - Adult hair pattern and Adult penile size and shape. Note: Normal external male genitalia. No inguinal hernias.   Peripheral Vascular Upper Extremity Inspection - Left - No Cyanotic nailbeds, Not  Ischemic. Right - No Cyanotic nailbeds, Not Ischemic.  Neurologic Neurologic evaluation reveals -normal attention span and ability to concentrate, able to name objects and repeat phrases. Appropriate fund of knowledge , normal sensation and normal coordination. Mental Status Affect - not angry, not paranoid. Cranial Nerves-Normal Bilaterally. Gait-Normal.  Neuropsychiatric Mental status exam performed with findings of-able to articulate well with normal speech/language, rate, volume and coherence, thought content normal with ability to perform basic computations and apply abstract reasoning and no evidence of hallucinations, delusions, obsessions or homicidal/suicidal ideation.  Musculoskeletal Global Assessment Spine, Ribs and Pelvis -  no instability, subluxation or laxity. Right Upper Extremity - no instability, subluxation or laxity.  Lymphatic Head & Neck  General Head & Neck Lymphatics: Bilateral - Description - No Localized lymphadenopathy. Axillary  General Axillary Region: Bilateral - Description - No Localized lymphadenopathy. Femoral & Inguinal  Generalized Femoral & Inguinal Lymphatics: Left - Description - No Localized lymphadenopathy. Right - Description - No Localized lymphadenopathy.    Assessment & Plan ( SESSILE COLONIC POLYP (211.3  K63.5) Impression: Large sessile serrated polyp of ascending colon. Adenomatous changes but no high-grade dysplasia. I gave copy of his colonoscopy report and pathology report.  No new events.  Ready for surgery  I do not think this is a great location for endoscopic mucosal resection. Would benefit from segmental colonic resection. Good candidate for minimally invasive approach. Would plan robotic intracorporeal resection and anastomosis. He is interested in proceeding. I discussed with him. Current Plans  Schedule for Surgery Discussed regular exercise with patient. The anatomy & physiology of the digestive tract was  discussed. The pathophysiology of the colon was discussed. Natural history risks without surgery was discussed. I feel the risks of no intervention will lead to serious problems that outweigh the operative risks; therefore, I recommended a partial colectomy to remove the pathology. Minimally invasive (Robotic/Laparoscopic) & open techniques were discussed.  Risks such as bleeding, infection, abscess, leak, reoperation, possible ostomy, hernia, heart attack, death, and other risks were discussed. I noted a good likelihood this will help address the problem. Goals of post-operative recovery were discussed as well. Need for adequate nutrition, daily bowel regimen and healthy physical activity, to optimize recovery was noted as well. We will work to minimize complications. Educational materials were available as well. Questions were answered. The patient expresses understanding & wishes to proceed with surgery. Pt Education - [facility] Colon Bowel Prep 2015 Miralax/Antibiotics Started Neomycin Sulfate 500MG , 2 (two) Tablet SEE NOTE, #6, 10/26/2014, No Refill. Local Order: TAKE TWO TABLETS AT 2 PM, 3 PM, AND 10 PM THE DAY PRIOR TO SURGERY Started Flagyl 500MG , 2 (two) Tablet SEE NOTE, #6, 10/26/2014, No Refill. Local Order: Take at 2pm, 3pm, and 10pm the day prior to your colon operation Pt Education - [facility] (Leonardo Makris) Pt Education - [facility] Abdominal Surgery (Ednah Hammock) Pt Education - [facility] Pain Control (Lashannon Bresnan)  Gregory Castaneda, M.D., F.A.C.S. Gastrointestinal and Minimally Invasive Surgery [facility] [address] [place], [place] [address] [phone] Main / Paging

## 2015-02-23 ENCOUNTER — Encounter (HOSPITAL_COMMUNITY): Payer: Self-pay

## 2015-02-23 ENCOUNTER — Encounter (HOSPITAL_COMMUNITY)
Admission: RE | Admit: 2015-02-23 | Discharge: 2015-02-23 | Disposition: A | Discharge status: Home / Self Care | Payer: 59 | Source: Ambulatory Visit | Attending: Surgery | Admitting: Surgery

## 2015-02-23 DIAGNOSIS — Z01812 Encounter for preprocedural laboratory examination: Secondary | ICD-10-CM | POA: Diagnosis not present

## 2015-02-23 DIAGNOSIS — K635 Polyp of colon: Secondary | ICD-10-CM | POA: Diagnosis not present

## 2015-02-23 HISTORY — DX: Unspecified osteoarthritis, unspecified site: M19.90

## 2015-02-23 HISTORY — DX: Urgency of urination: R39.15

## 2015-02-23 HISTORY — DX: Personal history of other malignant neoplasm of skin: Z85.828

## 2015-02-23 HISTORY — DX: Palpitations: R00.2

## 2015-02-23 HISTORY — DX: Cardiac murmur, unspecified: R01.1

## 2015-02-23 LAB — CBC
HCT: 44.1 % (ref 39.0–52.0)
Hemoglobin: 15.3 g/dL (ref 13.0–17.0)
MCH: 31.1 pg (ref 26.0–34.0)
MCHC: 34.7 g/dL (ref 30.0–36.0)
MCV: 89.6 fL (ref 78.0–100.0)
PLATELETS: 174 10*3/uL (ref 150–400)
RBC: 4.92 MIL/uL (ref 4.22–5.81)
RDW: 12.3 % (ref 11.5–15.5)
WBC: 4.4 10*3/uL (ref 4.0–10.5)

## 2015-02-23 LAB — BASIC METABOLIC PANEL
ANION GAP: 10 (ref 5–15)
BUN: 17 mg/dL (ref 6–20)
CALCIUM: 9.5 mg/dL (ref 8.9–10.3)
CO2: 25 mmol/L (ref 22–32)
Chloride: 105 mmol/L (ref 101–111)
Creatinine, Ser: 1.04 mg/dL (ref 0.61–1.24)
GFR calc Af Amer: >60 mL/min (ref >60)
GFR calc non Af Amer: >60 mL/min (ref >60)
Glucose, Bld: 96 mg/dL (ref 65–99)
Potassium: 3.6 mmol/L (ref 3.5–5.1)
Sodium: 140 mmol/L (ref 135–145)

## 2015-02-23 NOTE — Consult Note (Signed)
WOC ostomy consult note Patient seen per Dr. Johney [place]' request for assessment and marking of patient's abdomen in the event a stoma is needed intraoperatively. I previously marked this patient in April (11/17/14), but the surgery was postponed. Patient feels as if he has lost approximately 5-7 pounds since April.  Abdomen assessed in the sitting and standing positions. Soft, flaccid abdominal tissues noted, but I am able to palpate the rectus muscle. Site selected is located in the LUQ, 6.0cm to the left of the umbilicus and 6.9GE above. Stoma care will be more challenging is a stoma were to be in the LLQ. A second mark, in the RUQ is also marked, 6cm to the right of the umbilicus and 2cm above the umbilicus. Luetta Nutting are made with a skin marking pen and covered with a thin film transparent dressing. He and his wife are clear on the prep required for surgical procedure on Tuesday and have no questions.   [facility] nursing team will not follow, but will remain available to this patient, the nursing, surgical and medical teams.  Please re-consult if needed. Thanks, Maudie Flakes, MSN, RN, [place], [place], [place] [phone])

## 2015-02-23 NOTE — Patient Instructions (Signed)
YOUR PROCEDURE IS SCHEDULED ON : 02/27/15  REPORT TO [facility] MAIN ENTRANCE FOLLOW SIGNS TO EAST ELEVATOR - GO TO 3rd FLOOR CHECK IN AT 3 EAST NURSES STATION (SHORT STAY) AT:  5:30 AM  CALL THIS NUMBER IF YOU HAVE PROBLEMS THE MORNING OF SURGERY [phone]  REMEMBER:ONLY 1 PER PERSON MAY GO TO SHORT STAY WITH YOU TO GET READY THE MORNING OF YOUR SURGERY  DO NOT EAT FOOD OR DRINK LIQUIDS AFTER MIDNIGHT  TAKE THESE MEDICINES THE MORNING OF SURGERY: FLOMAX  STOP ASPIRIN / IBUPROFEN / ALEVE / VITAMINS / HERBAL MEDS __5__ DAYS BEFORE SURGERY  YOU MAY NOT HAVE ANY METAL ON YOUR BODY INCLUDING HAIR PINS AND PIERCING'S. DO NOT WEAR JEWELRY, MAKEUP, LOTIONS, POWDERS OR PERFUMES. DO NOT WEAR NAIL POLISH. DO NOT SHAVE 48 HRS PRIOR TO SURGERY. MEN MAY SHAVE FACE AND NECK.  DO NOT [place]. [facility] IS NOT RESPONSIBLE FOR VALUABLES.  CONTACTS, DENTURES OR PARTIALS MAY NOT BE WORN TO SURGERY. LEAVE SUITCASE IN CAR. CAN BE BROUGHT TO ROOM AFTER SURGERY.  PATIENTS DISCHARGED THE DAY OF SURGERY WILL NOT BE ALLOWED TO DRIVE HOME.  PLEASE READ OVER THE FOLLOWING INSTRUCTION SHEETS _________________________________________________________________________________                                          [facility] - PREPARING FOR SURGERY  Before surgery, you can play an important role.  Because skin is not sterile, your skin needs to be as free of germs as possible.  You can reduce the number of germs on your skin by washing with CHG (chlorahexidine gluconate) soap before surgery.  CHG is an antiseptic cleaner which kills germs and bonds with the skin to continue killing germs even after washing. Please DO NOT use if you have an allergy to CHG or antibacterial soaps.  If your skin becomes reddened/irritated stop using the CHG and inform your nurse when you arrive at Short Stay. Do not shave (including legs and underarms) for at least 48 hours prior to the  first CHG shower.  You may shave your face. Please follow these instructions carefully:   1.  Shower with CHG Soap the night before surgery and the  morning of Surgery.   2.  If you choose to wash your hair, wash your hair first as usual with your  normal  Shampoo.   3.  After you shampoo, rinse your hair and body thoroughly to remove the  shampoo.                                         4.  Use CHG as you would any other liquid soap.  You can apply chg directly  to the skin and wash . Gently wash with scrungie or clean wascloth    5.  Apply the CHG Soap to your body ONLY FROM THE NECK DOWN.   Do not use on open                           Wound or open sores. Avoid contact with eyes, ears mouth and genitals (private parts).  Genitals (private parts) with your normal soap.              6.  Wash thoroughly, paying special attention to the area where your surgery  will be performed.   7.  Thoroughly rinse your body with warm water from the neck down.   8.  DO NOT shower/wash with your normal soap after using and rinsing off  the CHG Soap .                9.  Pat yourself dry with a clean towel.             10.  Wear clean night clothes to bed after shower             11.  Place clean sheets on your bed the night of your first shower and do not  sleep with pets.  Day of Surgery : Do not apply any lotions/deodorants the morning of surgery.  Please wear clean clothes to the hospital/surgery center.  FAILURE TO FOLLOW THESE INSTRUCTIONS MAY RESULT IN THE CANCELLATION OF YOUR SURGERY    PATIENT SIGNATURE_________________________________  ______________________________________________________________________    WHAT IS A BLOOD TRANSFUSION? Blood Transfusion Information  A transfusion is the replacement of blood or some of its parts. Blood is made up of multiple cells which provide different functions.  Red blood cells carry oxygen and are used for blood loss  replacement.  White blood cells fight against infection.  Platelets control bleeding.  Plasma helps clot blood.  Other blood products are available for specialized needs, such as hemophilia or other clotting disorders. BEFORE THE TRANSFUSION  Who gives blood for transfusions?   Healthy volunteers who are fully evaluated to make sure their blood is safe. This is blood bank blood. Transfusion therapy is the safest it has ever been in the practice of medicine. Before blood is taken from a donor, a complete history is taken to make sure that person has no history of diseases nor engages in risky social behavior (examples are intravenous drug use or sexual activity with multiple partners). The donor's travel history is screened to minimize risk of transmitting infections, such as malaria. The donated blood is tested for signs of infectious diseases, such as HIV and hepatitis. The blood is then tested to be sure it is compatible with you in order to minimize the chance of a transfusion reaction. If you or a relative donates blood, this is often done in anticipation of surgery and is not appropriate for emergency situations. It takes many days to process the donated blood. RISKS AND COMPLICATIONS Although transfusion therapy is very safe and saves many lives, the main dangers of transfusion include:   Getting an infectious disease.  Developing a transfusion reaction. This is an allergic reaction to something in the blood you were given. Every precaution is taken to prevent this. The decision to have a blood transfusion has been considered carefully by your caregiver before blood is given. Blood is not given unless the benefits outweigh the risks. AFTER THE TRANSFUSION  Right after receiving a blood transfusion, you will usually feel much better and more energetic. This is especially true if your red blood cells have gotten low (anemic). The transfusion raises the level of the red blood cells which  carry oxygen, and this usually causes an energy increase.  The nurse administering the transfusion will monitor you carefully for complications. HOME CARE INSTRUCTIONS  No special instructions are needed after a transfusion. You may  find your energy is better. Speak with your caregiver about any limitations on activity for underlying diseases you may have. SEEK MEDICAL CARE IF:   Your condition is not improving after your transfusion.  You develop redness or irritation at the intravenous (IV) site. SEEK IMMEDIATE MEDICAL CARE IF:  Any of the following symptoms occur over the next 12 hours:  Shaking chills.  You have a temperature by mouth above 102 F (38.9 C), not controlled by medicine.  Chest, back, or muscle pain.  People around you feel you are not acting correctly or are confused.  Shortness of breath or difficulty breathing.  Dizziness and fainting.  You get a rash or develop hives.  You have a decrease in urine output.  Your urine turns a dark color or changes to pink, red, or brown. Any of the following symptoms occur over the next 10 days:  You have a temperature by mouth above 102 F (38.9 C), not controlled by medicine.  Shortness of breath.  Weakness after normal activity.  The white part of the eye turns yellow (jaundice).  You have a decrease in the amount of urine or are urinating less often.  Your urine turns a dark color or changes to pink, red, or brown. Document Released: 08/01/2000 Document Revised: 10/27/2011 Document Reviewed: 03/20/2008 [facility] Patient Information 2014 [place], [place].  _______________________________________________________________________

## 2015-02-24 LAB — HEMOGLOBIN A1C
Hgb A1c MFr Bld: 5.8 % — ABNORMAL HIGH (ref 4.8–5.6)
MEAN PLASMA GLUCOSE: 120 mg/dL

## 2015-02-26 MED ORDER — GENTAMICIN SULFATE 40 MG/ML IJ SOLN
360.0000 mg | INTRAVENOUS | Status: AC
  Administered 2015-02-27: 360 mg via INTRAVENOUS
  Filled 2015-02-26: qty 9

## 2015-02-26 MED ORDER — SODIUM CHLORIDE 0.9 % IV SOLN
INTRAVENOUS | Status: DC
  Filled 2015-02-26: qty 6

## 2015-02-26 NOTE — Progress Notes (Signed)
   02/23/15 1324  OBSTRUCTIVE SLEEP APNEA  Have you ever been diagnosed with sleep apnea through a sleep study? No  Do you snore loudly (loud enough to be heard through closed doors)?  0  Do you often feel tired, fatigued, or sleepy during the daytime? 0  Has anyone observed you stop breathing during your sleep? 0  Do you have, or are you being treated for high blood pressure? 1  BMI more than 35 kg/m2? 0  Age over 64 years old? 1  Neck circumference greater than 40 cm/16 inches? 1  Gender: 1

## 2015-02-26 NOTE — Anesthesia Preprocedure Evaluation (Addendum)
Anesthesia Evaluation  Patient identified by MRN, date of birth, ID band Patient awake    Reviewed: Allergy & Precautions, H&P , NPO status , Patient's Chart, lab work & pertinent test results, reviewed documented beta blocker date and time , Unable to perform ROS - Chart review only  History of Anesthesia Complications (+) PONV and history of anesthetic complications  Airway Mallampati: II  TM Distance: >3 FB Neck ROM: full    Dental no notable dental hx.    Pulmonary former smoker,  breath sounds clear to auscultation  Pulmonary exam normal       Cardiovascular hypertension, On Medications Normal cardiovascular exam+ Valvular Problems/Murmurs (as child) Rhythm:regular Rate:Normal  Patient has history of AV malformations   Neuro/Psych negative neurological ROS  negative psych ROS   GI/Hepatic negative GI ROS, Neg liver ROS,   Endo/Other  negative endocrine ROS  Renal/GU Renal disease     Musculoskeletal  (+) Arthritis - (lumbar herniated disk),   Abdominal   Peds  Hematology negative hematology ROS (+)   Anesthesia Other Findings NPO appropriate, no meds today, allergies reviewed Denies active cardiac or pulmonary symptoms, METS > 4 No recent congestive cough or symptoms of upper respiratory infection   Reproductive/Obstetrics negative OB ROS                           Anesthesia Physical Anesthesia Plan  ASA: II  Anesthesia Plan: General ETT   Post-op Pain Management:    Induction:   Airway Management Planned:   Additional Equipment:   Intra-op Plan:   Post-operative Plan:   Informed Consent: I have reviewed the patients History and Physical, chart, labs and discussed the procedure including the risks, benefits and alternatives for the proposed anesthesia with the patient or authorized representative who has indicated his/her understanding and acceptance.   Dental Advisory  Given  Plan Discussed with: Anesthesiologist and CRNA  Anesthesia Plan Comments:        Anesthesia Quick Evaluation

## 2015-02-27 ENCOUNTER — Inpatient Hospital Stay (HOSPITAL_COMMUNITY): Payer: 59 | Admitting: Anesthesiology

## 2015-02-27 ENCOUNTER — Encounter (HOSPITAL_COMMUNITY): Payer: Self-pay | Admitting: Registered Nurse

## 2015-02-27 ENCOUNTER — Encounter (HOSPITAL_COMMUNITY)
Admission: RE | Disposition: A | Discharge status: Home / Self Care | Payer: Self-pay | Source: Ambulatory Visit | Attending: Surgery

## 2015-02-27 ENCOUNTER — Inpatient Hospital Stay (HOSPITAL_COMMUNITY)
Admission: RE | Admit: 2015-02-27 | Discharge: 2015-03-01 | DRG: 331 | Disposition: A | Discharge status: Home / Self Care | Payer: 59 | Source: Ambulatory Visit | Attending: Surgery | Admitting: Surgery

## 2015-02-27 DIAGNOSIS — M1990 Unspecified osteoarthritis, unspecified site: Secondary | ICD-10-CM | POA: Diagnosis present

## 2015-02-27 DIAGNOSIS — I10 Essential (primary) hypertension: Secondary | ICD-10-CM | POA: Diagnosis present

## 2015-02-27 DIAGNOSIS — Z8371 Family history of colonic polyps: Secondary | ICD-10-CM | POA: Diagnosis not present

## 2015-02-27 DIAGNOSIS — Z85828 Personal history of other malignant neoplasm of skin: Secondary | ICD-10-CM

## 2015-02-27 DIAGNOSIS — D122 Benign neoplasm of ascending colon: Principal | ICD-10-CM | POA: Diagnosis present

## 2015-02-27 DIAGNOSIS — Z823 Family history of stroke: Secondary | ICD-10-CM | POA: Diagnosis not present

## 2015-02-27 DIAGNOSIS — Z8249 Family history of ischemic heart disease and other diseases of the circulatory system: Secondary | ICD-10-CM

## 2015-02-27 DIAGNOSIS — Z87442 Personal history of urinary calculi: Secondary | ICD-10-CM | POA: Diagnosis not present

## 2015-02-27 DIAGNOSIS — K635 Polyp of colon: Secondary | ICD-10-CM | POA: Diagnosis present

## 2015-02-27 DIAGNOSIS — Z808 Family history of malignant neoplasm of other organs or systems: Secondary | ICD-10-CM | POA: Diagnosis not present

## 2015-02-27 DIAGNOSIS — Z801 Family history of malignant neoplasm of trachea, bronchus and lung: Secondary | ICD-10-CM

## 2015-02-27 DIAGNOSIS — Z86010 Personal history of colonic polyps: Secondary | ICD-10-CM | POA: Diagnosis not present

## 2015-02-27 HISTORY — PX: LAPAROSCOPIC PARTIAL COLECTOMY: SHX5907

## 2015-02-27 LAB — TYPE AND SCREEN
ABO/RH(D): A POS
Antibody Screen: NEGATIVE

## 2015-02-27 LAB — ABO/RH: ABO/RH(D): A POS

## 2015-02-27 SURGERY — LAPAROSCOPIC PARTIAL COLECTOMY
Anesthesia: General

## 2015-02-27 MED ORDER — ONDANSETRON HCL 4 MG/2ML IJ SOLN: 4.0000 mg | Freq: Four times a day (QID) | INTRAMUSCULAR | Status: DC | PRN

## 2015-02-27 MED ORDER — CHLORHEXIDINE GLUCONATE 4 % EX LIQD: 60.0000 mL | Freq: Once | CUTANEOUS | Status: DC

## 2015-02-27 MED ORDER — MENTHOL 3 MG MT LOZG
1.0000 | LOZENGE | OROMUCOSAL | Status: DC | PRN
  Filled 2015-02-27: qty 9

## 2015-02-27 MED ORDER — CLINDAMYCIN PHOSPHATE 900 MG/50ML IV SOLN
INTRAVENOUS | Status: AC
  Filled 2015-02-27: qty 50

## 2015-02-27 MED ORDER — HYDROMORPHONE HCL 2 MG/ML IJ SOLN
INTRAMUSCULAR | Status: AC
  Filled 2015-02-27: qty 1

## 2015-02-27 MED ORDER — MIDAZOLAM HCL 5 MG/5ML IJ SOLN
INTRAMUSCULAR | Status: DC | PRN
  Administered 2015-02-27: 2 mg via INTRAVENOUS
  Administered 2015-02-27: 1 mg via INTRAVENOUS

## 2015-02-27 MED ORDER — FENTANYL CITRATE (PF) 100 MCG/2ML IJ SOLN: 25.0000 ug | INTRAMUSCULAR | Status: DC | PRN

## 2015-02-27 MED ORDER — LACTATED RINGERS IV BOLUS (SEPSIS): 1000.0000 mL | Freq: Three times a day (TID) | INTRAVENOUS | Status: AC | PRN

## 2015-02-27 MED ORDER — MIDAZOLAM HCL 2 MG/2ML IJ SOLN
INTRAMUSCULAR | Status: AC
  Filled 2015-02-27: qty 2

## 2015-02-27 MED ORDER — CLINDAMYCIN PHOSPHATE 900 MG/50ML IV SOLN
900.0000 mg | Freq: Three times a day (TID) | INTRAVENOUS | Status: AC
  Administered 2015-02-27: 900 mg via INTRAVENOUS
  Filled 2015-02-27: qty 50

## 2015-02-27 MED ORDER — GLYCOPYRROLATE 0.2 MG/ML IJ SOLN
INTRAMUSCULAR | Status: DC | PRN
  Administered 2015-02-27: 0.2 mg via INTRAVENOUS
  Administered 2015-02-27: 0.4 mg via INTRAVENOUS

## 2015-02-27 MED ORDER — ONDANSETRON HCL 4 MG/2ML IJ SOLN
INTRAMUSCULAR | Status: DC | PRN
  Administered 2015-02-27: 4 mg via INTRAVENOUS

## 2015-02-27 MED ORDER — LIDOCAINE HCL (CARDIAC) 20 MG/ML IV SOLN
INTRAVENOUS | Status: DC | PRN
  Administered 2015-02-27: 50 mg via INTRATRACHEAL
  Administered 2015-02-27: 50 mg via INTRAVENOUS

## 2015-02-27 MED ORDER — SCOPOLAMINE 1 MG/3DAYS TD PT72
MEDICATED_PATCH | TRANSDERMAL | Status: AC
  Filled 2015-02-27: qty 1

## 2015-02-27 MED ORDER — HYDROMORPHONE HCL 1 MG/ML IJ SOLN
INTRAMUSCULAR | Status: DC | PRN
  Administered 2015-02-27 (×2): 1 mg via INTRAVENOUS

## 2015-02-27 MED ORDER — PROMETHAZINE HCL 25 MG/ML IJ SOLN: 6.2500 mg | INTRAMUSCULAR | Status: DC | PRN

## 2015-02-27 MED ORDER — PROPOFOL 10 MG/ML IV BOLUS
INTRAVENOUS | Status: AC
  Filled 2015-02-27: qty 20

## 2015-02-27 MED ORDER — CLINDAMYCIN PHOSPHATE 900 MG/50ML IV SOLN
900.0000 mg | INTRAVENOUS | Status: AC
  Administered 2015-02-27: 900 mg via INTRAVENOUS

## 2015-02-27 MED ORDER — LIDOCAINE HCL (CARDIAC) 20 MG/ML IV SOLN
INTRAVENOUS | Status: AC
  Filled 2015-02-27: qty 5

## 2015-02-27 MED ORDER — DIPHENHYDRAMINE HCL 12.5 MG/5ML PO ELIX: 12.5000 mg | ORAL_SOLUTION | Freq: Four times a day (QID) | ORAL | Status: DC | PRN

## 2015-02-27 MED ORDER — ALVIMOPAN 12 MG PO CAPS
12.0000 mg | ORAL_CAPSULE | Freq: Once | ORAL | Status: AC
  Administered 2015-02-27: 12 mg via ORAL
  Filled 2015-02-27: qty 1

## 2015-02-27 MED ORDER — ROCURONIUM BROMIDE 100 MG/10ML IV SOLN
INTRAVENOUS | Status: AC
  Filled 2015-02-27: qty 1

## 2015-02-27 MED ORDER — MAGIC MOUTHWASH
15.0000 mL | Freq: Four times a day (QID) | ORAL | Status: DC | PRN
  Filled 2015-02-27: qty 15

## 2015-02-27 MED ORDER — METOPROLOL TARTRATE 12.5 MG HALF TABLET
12.5000 mg | ORAL_TABLET | Freq: Two times a day (BID) | ORAL | Status: DC | PRN
  Filled 2015-02-27: qty 1

## 2015-02-27 MED ORDER — DIPHENHYDRAMINE HCL 50 MG/ML IJ SOLN: 12.5000 mg | Freq: Four times a day (QID) | INTRAMUSCULAR | Status: DC | PRN

## 2015-02-27 MED ORDER — ONDANSETRON HCL 4 MG/2ML IJ SOLN
INTRAMUSCULAR | Status: AC
  Filled 2015-02-27: qty 2

## 2015-02-27 MED ORDER — SODIUM CHLORIDE 0.9 % IJ SOLN
INTRAMUSCULAR | Status: DC | PRN
  Administered 2015-02-27: 80 mL via INTRAVENOUS

## 2015-02-27 MED ORDER — HYDROMORPHONE HCL 1 MG/ML IJ SOLN: 0.2500 mg | INTRAMUSCULAR | Status: DC | PRN

## 2015-02-27 MED ORDER — SODIUM CHLORIDE 0.9 % IJ SOLN
INTRAMUSCULAR | Status: AC
  Filled 2015-02-27: qty 30

## 2015-02-27 MED ORDER — ALVIMOPAN 12 MG PO CAPS
12.0000 mg | ORAL_CAPSULE | Freq: Two times a day (BID) | ORAL | Status: DC
  Filled 2015-02-27 (×2): qty 1

## 2015-02-27 MED ORDER — FENTANYL CITRATE (PF) 250 MCG/5ML IJ SOLN
INTRAMUSCULAR | Status: AC
  Filled 2015-02-27: qty 5

## 2015-02-27 MED ORDER — LIP MEDEX EX OINT
1.0000 "application " | TOPICAL_OINTMENT | Freq: Two times a day (BID) | CUTANEOUS | Status: DC
  Administered 2015-02-27 – 2015-03-01 (×5): 1 via TOPICAL
  Filled 2015-02-27 (×3): qty 7

## 2015-02-27 MED ORDER — DEXAMETHASONE SODIUM PHOSPHATE 10 MG/ML IJ SOLN
INTRAMUSCULAR | Status: AC
Start: 2015-02-27 — End: 2015-02-27
  Filled 2015-02-27: qty 1

## 2015-02-27 MED ORDER — PHENYLEPHRINE HCL 10 MG/ML IJ SOLN
INTRAMUSCULAR | Status: DC | PRN
  Administered 2015-02-27 (×2): 120 ug via INTRAVENOUS

## 2015-02-27 MED ORDER — 0.9 % SODIUM CHLORIDE (POUR BTL) OPTIME
TOPICAL | Status: DC | PRN
  Administered 2015-02-27: 1000 mL

## 2015-02-27 MED ORDER — LACTATED RINGERS IV SOLN
INTRAVENOUS | Status: DC | PRN
  Administered 2015-02-27 (×3): via INTRAVENOUS

## 2015-02-27 MED ORDER — BUPIVACAINE-EPINEPHRINE 0.25% -1:200000 IJ SOLN
INTRAMUSCULAR | Status: DC | PRN
  Administered 2015-02-27: 50 mL

## 2015-02-27 MED ORDER — TAMSULOSIN HCL 0.4 MG PO CAPS
0.4000 mg | ORAL_CAPSULE | Freq: Every day | ORAL | Status: DC
  Administered 2015-02-28 – 2015-03-01 (×2): 0.4 mg via ORAL
  Filled 2015-02-27 (×3): qty 1

## 2015-02-27 MED ORDER — LACTATED RINGERS IR SOLN
Status: DC | PRN
  Administered 2015-02-27: 1000 mL

## 2015-02-27 MED ORDER — NEOSTIGMINE METHYLSULFATE 10 MG/10ML IV SOLN
INTRAVENOUS | Status: AC
  Filled 2015-02-27: qty 1

## 2015-02-27 MED ORDER — PHENOL 1.4 % MT LIQD
2.0000 | OROMUCOSAL | Status: DC | PRN
  Filled 2015-02-27: qty 177

## 2015-02-27 MED ORDER — HEPARIN SODIUM (PORCINE) 5000 UNIT/ML IJ SOLN
5000.0000 [IU] | Freq: Once | INTRAMUSCULAR | Status: AC
  Administered 2015-02-27: 5000 [IU] via SUBCUTANEOUS
  Filled 2015-02-27: qty 1

## 2015-02-27 MED ORDER — PROPOFOL 10 MG/ML IV BOLUS
INTRAVENOUS | Status: DC | PRN
  Administered 2015-02-27: 200 mg via INTRAVENOUS
  Administered 2015-02-27: 30 mg via INTRAVENOUS

## 2015-02-27 MED ORDER — HYDROMORPHONE HCL 1 MG/ML IJ SOLN: 0.5000 mg | INTRAMUSCULAR | Status: DC | PRN

## 2015-02-27 MED ORDER — SODIUM CHLORIDE 0.9 % IV SOLN
INTRAVENOUS | Status: DC
  Administered 2015-02-27 – 2015-02-28 (×2): via INTRAVENOUS
  Administered 2015-02-28: 1000 mL via INTRAVENOUS
  Administered 2015-03-01: 75 mL/h via INTRAVENOUS

## 2015-02-27 MED ORDER — GLYCOPYRROLATE 0.2 MG/ML IJ SOLN
INTRAMUSCULAR | Status: AC
  Filled 2015-02-27: qty 2

## 2015-02-27 MED ORDER — ADULT MULTIVITAMIN W/MINERALS CH
1.0000 | ORAL_TABLET | Freq: Every day | ORAL | Status: DC
  Administered 2015-02-27 – 2015-03-01 (×3): 1 via ORAL
  Filled 2015-02-27 (×3): qty 1

## 2015-02-27 MED ORDER — BUPIVACAINE LIPOSOME 1.3 % IJ SUSP
20.0000 mL | INTRAMUSCULAR | Status: DC
  Filled 2015-02-27: qty 20

## 2015-02-27 MED ORDER — KETOROLAC TROMETHAMINE 30 MG/ML IJ SOLN
INTRAMUSCULAR | Status: DC | PRN
  Administered 2015-02-27: 30 mg via INTRAVENOUS

## 2015-02-27 MED ORDER — BUPIVACAINE LIPOSOME 1.3 % IJ SUSP
INTRAMUSCULAR | Status: DC | PRN
  Administered 2015-02-27: 20 mL

## 2015-02-27 MED ORDER — DEXAMETHASONE SODIUM PHOSPHATE 10 MG/ML IJ SOLN
INTRAMUSCULAR | Status: DC | PRN
  Administered 2015-02-27: 10 mg via INTRAVENOUS

## 2015-02-27 MED ORDER — ALUM & MAG HYDROXIDE-SIMETH 200-200-20 MG/5ML PO SUSP: 30.0000 mL | Freq: Four times a day (QID) | ORAL | Status: DC | PRN

## 2015-02-27 MED ORDER — ACETAMINOPHEN 500 MG PO TABS
1000.0000 mg | ORAL_TABLET | Freq: Three times a day (TID) | ORAL | Status: DC
  Administered 2015-02-27 – 2015-02-28 (×5): 1000 mg via ORAL
  Filled 2015-02-27 (×8): qty 2

## 2015-02-27 MED ORDER — OXYCODONE HCL 5 MG PO TABS: 5.0000 mg | ORAL_TABLET | ORAL | Status: DC | PRN

## 2015-02-27 MED ORDER — FENTANYL CITRATE (PF) 100 MCG/2ML IJ SOLN
INTRAMUSCULAR | Status: DC | PRN
  Administered 2015-02-27: 50 ug via INTRAVENOUS
  Administered 2015-02-27: 100 ug via INTRAVENOUS
  Administered 2015-02-27: 50 ug via INTRAVENOUS
  Administered 2015-02-27 (×3): 100 ug via INTRAVENOUS

## 2015-02-27 MED ORDER — NEOSTIGMINE METHYLSULFATE 10 MG/10ML IV SOLN
INTRAVENOUS | Status: DC | PRN
  Administered 2015-02-27: 2.5 mg via INTRAVENOUS

## 2015-02-27 MED ORDER — SODIUM CHLORIDE 0.9 % IV SOLN
INTRAVENOUS | Status: DC | PRN
  Administered 2015-02-27: 10:00:00 via INTRAPERITONEAL

## 2015-02-27 MED ORDER — HEPARIN SODIUM (PORCINE) 5000 UNIT/ML IJ SOLN
5000.0000 [IU] | Freq: Three times a day (TID) | INTRAMUSCULAR | Status: DC
  Administered 2015-02-27 – 2015-03-01 (×5): 5000 [IU] via SUBCUTANEOUS
  Filled 2015-02-27 (×8): qty 1

## 2015-02-27 MED ORDER — ROCURONIUM BROMIDE 100 MG/10ML IV SOLN
INTRAVENOUS | Status: DC | PRN
  Administered 2015-02-27 (×2): 10 mg via INTRAVENOUS
  Administered 2015-02-27: 70 mg via INTRAVENOUS

## 2015-02-27 MED ORDER — KETOROLAC TROMETHAMINE 30 MG/ML IJ SOLN
INTRAMUSCULAR | Status: AC
  Filled 2015-02-27: qty 1

## 2015-02-27 MED ORDER — SACCHAROMYCES BOULARDII 250 MG PO CAPS
250.0000 mg | ORAL_CAPSULE | Freq: Two times a day (BID) | ORAL | Status: DC
  Administered 2015-02-27 – 2015-03-01 (×5): 250 mg via ORAL
  Filled 2015-02-27 (×6): qty 1

## 2015-02-27 MED ORDER — METOPROLOL TARTRATE 1 MG/ML IV SOLN
5.0000 mg | Freq: Four times a day (QID) | INTRAVENOUS | Status: DC | PRN
  Filled 2015-02-27: qty 5

## 2015-02-27 MED ORDER — ONDANSETRON HCL 4 MG PO TABS: 4.0000 mg | ORAL_TABLET | Freq: Four times a day (QID) | ORAL | Status: DC | PRN

## 2015-02-27 MED ORDER — SODIUM CHLORIDE 0.9 % IJ SOLN
INTRAMUSCULAR | Status: AC
  Filled 2015-02-27: qty 50

## 2015-02-27 MED ORDER — BUPIVACAINE-EPINEPHRINE 0.25% -1:200000 IJ SOLN
INTRAMUSCULAR | Status: AC
  Filled 2015-02-27: qty 1

## 2015-02-27 SURGICAL SUPPLY — 84 items
APPLIER CLIP 5 13 M/L LIGAMAX5 (MISCELLANEOUS)
APPLIER CLIP ROT 10 11.4 M/L (STAPLE)
APR CLP MED LRG 11.4X10 (STAPLE)
APR CLP MED LRG 5 ANG JAW (MISCELLANEOUS)
CABLE HIGH FREQUENCY MONO STRZ (ELECTRODE) ×3 IMPLANT
CATH KIT ON-Q SILVERSOAK 7.5 (CATHETERS) IMPLANT
CATH KIT ON-Q SILVERSOAK 7.5IN (CATHETERS) IMPLANT
CELLS DAT CNTRL 66122 CELL SVR (MISCELLANEOUS) IMPLANT
CHLORAPREP W/TINT 26ML (MISCELLANEOUS) ×3 IMPLANT
CLIP APPLIE 5 13 M/L LIGAMAX5 (MISCELLANEOUS) IMPLANT
CLIP APPLIE ROT 10 11.4 M/L (STAPLE) IMPLANT
COUNTER NEEDLE 20 DBL MAG RED (NEEDLE) ×3 IMPLANT
COVER SURGICAL LIGHT HANDLE (MISCELLANEOUS) ×3 IMPLANT
DECANTER SPIKE VIAL GLASS SM (MISCELLANEOUS) ×3 IMPLANT
DRAIN CHANNEL 19F RND (DRAIN) IMPLANT
DRAPE LAPAROSCOPIC ABDOMINAL (DRAPES) ×1 IMPLANT
DRAPE SURG IRRIG POUCH 19X23 (DRAPES) ×3 IMPLANT
DRSG OPSITE POSTOP 4X10 (GAUZE/BANDAGES/DRESSINGS) IMPLANT
DRSG OPSITE POSTOP 4X6 (GAUZE/BANDAGES/DRESSINGS) ×2 IMPLANT
DRSG OPSITE POSTOP 4X8 (GAUZE/BANDAGES/DRESSINGS) IMPLANT
DRSG TEGADERM 2-3/8X2-3/4 SM (GAUZE/BANDAGES/DRESSINGS) ×12 IMPLANT
DRSG TEGADERM 4X4.75 (GAUZE/BANDAGES/DRESSINGS) IMPLANT
ELECT PENCIL ROCKER SW 15FT (MISCELLANEOUS) ×6 IMPLANT
ELECT REM PT RETURN 15FT ADLT (MISCELLANEOUS) ×3 IMPLANT
ENDOLOOP SUT PDS II  0 18 (SUTURE)
ENDOLOOP SUT PDS II 0 18 (SUTURE) IMPLANT
EVACUATOR SILICONE 100CC (DRAIN) IMPLANT
GAUZE SPONGE 2X2 8PLY STRL LF (GAUZE/BANDAGES/DRESSINGS) ×1 IMPLANT
GAUZE SPONGE 4X4 12PLY STRL (GAUZE/BANDAGES/DRESSINGS) ×3 IMPLANT
GLOVE BIOGEL PI IND STRL 6 (GLOVE) IMPLANT
GLOVE BIOGEL PI IND STRL 6.5 (GLOVE) IMPLANT
GLOVE BIOGEL PI INDICATOR 6 (GLOVE) ×2
GLOVE BIOGEL PI INDICATOR 6.5 (GLOVE) ×2
GLOVE ECLIPSE 8.0 STRL XLNG CF (GLOVE) ×9 IMPLANT
GLOVE INDICATOR 8.0 STRL GRN (GLOVE) ×8 IMPLANT
GOWN STRL REUS W/TWL XL LVL3 (GOWN DISPOSABLE) ×22 IMPLANT
LEGGING LITHOTOMY PAIR STRL (DRAPES) ×3 IMPLANT
LUBRICANT JELLY K Y 4OZ (MISCELLANEOUS) IMPLANT
NEEDLE HYPO 22GX1.5 SAFETY (NEEDLE) ×2 IMPLANT
PACK COLON (CUSTOM PROCEDURE TRAY) ×3 IMPLANT
PEN SKIN MARKING BROAD (MISCELLANEOUS) ×3 IMPLANT
PORT LAP GEL ALEXIS MED 5-9CM (MISCELLANEOUS) ×3 IMPLANT
RELOAD STAPLE 60 3.6 BLU REG (STAPLE) IMPLANT
RELOAD STAPLER BLUE 60MM (STAPLE) ×3 IMPLANT
RETRACTOR WND ALEXIS 18 MED (MISCELLANEOUS) IMPLANT
RTRCTR WOUND ALEXIS 18CM MED (MISCELLANEOUS)
SCISSORS LAP 5X35 DISP (ENDOMECHANICALS) ×3 IMPLANT
SEALER TISSUE G2 STRG ARTC 35C (ENDOMECHANICALS) IMPLANT
SET IRRIG TUBING LAPAROSCOPIC (IRRIGATION / IRRIGATOR) ×3 IMPLANT
SLEEVE XCEL OPT CAN 5 100 (ENDOMECHANICALS) ×6 IMPLANT
SPONGE GAUZE 2X2 STER 10/PKG (GAUZE/BANDAGES/DRESSINGS) ×4
SPONGE LAP 18X18 X RAY DECT (DISPOSABLE) ×3 IMPLANT
STAPLE ECHEON FLEX 60 POW ENDO (STAPLE) ×2 IMPLANT
STAPLER RELOAD BLUE 60MM (STAPLE) ×9
STAPLER VISISTAT 35W (STAPLE) IMPLANT
SUCTION POOLE TIP (SUCTIONS) ×3 IMPLANT
SUT MNCRL AB 4-0 PS2 18 (SUTURE) ×5 IMPLANT
SUT PDS AB 1 CTX 36 (SUTURE) ×4 IMPLANT
SUT PDS AB 1 TP1 96 (SUTURE) IMPLANT
SUT PROLENE 0 CT 2 (SUTURE) IMPLANT
SUT PROLENE 2 0 SH DA (SUTURE) IMPLANT
SUT SILK 2 0 (SUTURE) ×3
SUT SILK 2 0 SH CR/8 (SUTURE) ×3 IMPLANT
SUT SILK 2-0 18XBRD TIE 12 (SUTURE) ×1 IMPLANT
SUT SILK 3 0 (SUTURE) ×3
SUT SILK 3 0 SH CR/8 (SUTURE) ×3 IMPLANT
SUT SILK 3-0 18XBRD TIE 12 (SUTURE) ×1 IMPLANT
SUT V-LOC BARB 180 2/0GR6 GS22 (SUTURE) ×6
SUT VICRYL 0 UR6 27IN ABS (SUTURE) ×2 IMPLANT
SUTURE V-LC BRB 180 2/0GR6GS22 (SUTURE) IMPLANT
SYR 20CC LL (SYRINGE) ×2 IMPLANT
SYS LAPSCP GELPORT 120MM (MISCELLANEOUS)
SYSTEM LAPSCP GELPORT 120MM (MISCELLANEOUS) IMPLANT
TAPE UMBILICAL COTTON 1/8X30 (MISCELLANEOUS) ×3 IMPLANT
TOWEL OR 17X26 10 PK STRL BLUE (TOWEL DISPOSABLE) ×6 IMPLANT
TOWEL OR NON WOVEN STRL DISP B (DISPOSABLE) ×3 IMPLANT
TRAY FOLEY CATH 16FRSI W/METER (SET/KITS/TRAYS/PACK) ×2 IMPLANT
TRAY FOLEY W/METER SILVER 14FR (SET/KITS/TRAYS/PACK) IMPLANT
TROCAR BLADELESS OPT 5 100 (ENDOMECHANICALS) ×3 IMPLANT
TROCAR XCEL NON-BLD 11X100MML (ENDOMECHANICALS) ×1 IMPLANT
TUBING CONNECTING 10 (TUBING) IMPLANT
TUBING CONNECTING 10' (TUBING)
TUBING FILTER THERMOFLATOR (ELECTROSURGICAL) ×3 IMPLANT
TUNNELER SHEATH ON-Q 16GX12 DP (PAIN MANAGEMENT) IMPLANT

## 2015-02-27 NOTE — H&P (View-Only) (Signed)
Gregory Castaneda 10/26/2014 8:40 AM Location: [facility] Patient #: [id number] DOB: 07-11-1951 Married / Language: Gregory Castaneda / Race: White Male  History of Present Illness Gregory Hector MD; 10/26/2014 9:44 AM) Patient words: colon polyp.  The patient is a 64 year old male who presents with a colonic polyp. Pleasant active male sent by his gastroenterologist, Dr. Delfin Edis, for concern of large hepatic flexure sessile serrated adenomatous polyp. Active male. Former Production designer, theatre/television/film. Mother had history of colon polyps. Underwent screening colonoscopy. Numerous small adenomatous polyps removed. Larger sessile serrated adenomatous polyp found at hepatic flexure of the colon by endoscopy as well as by plain film. Patient normally has one bowel movement a day. Rather physically active. No history of Crohn's or ulcerative colitis. No inflammatory bowel disease. No irritable bowel syndrome. Mild urinary symptoms controlled with Flomax. He's never had abdominal surgeries. Patient concern of large polyp not able to be removed endoscopically, surgical consultation requested. Patient also has a history of arteriovenous malformations. Had one glued shut by endovascular approach. Has a another one near his RIGHT femoral vein that is stable by MRI for the past few years. No problems with walking. No problems with hearing or headaches. He does not hear a thrill in his right ear anymore as he did before.  No new events.  Ready for surgery   Other Problems Davy Pique Bynum, [place]; 10/26/2014 8:40 AM) Back Pain Diverticulosis High blood pressure Other disease, cancer, significant illness  Past Surgical History Marjean Donna, [place]; 10/26/2014 8:40 AM) Colon Polyp Removal - Colonoscopy  Diagnostic Studies History Marjean Donna, CMA; 10/26/2014 8:40 AM) Colonoscopy within last year  Allergies Davy Pique Bynum, CMA; 10/26/2014 8:42 AM) Penicillamine *ASSORTED CLASSES*  Medication History  (Sonya Bynum, CMA; 10/26/2014 8:42 AM) Hydrochlorothiazide (25MG  Tablet, Oral) Active. Tamsulosin HCl (0.4MG  Capsule, Oral) Active. Diazepam (5MG  Tablet, Oral) Active. Cyclobenzaprine HCl (10MG  Tablet, Oral) Active. Medications Reconciled  Social History Marjean Donna, CMA; 10/26/2014 8:40 AM) Alcohol use Moderate alcohol use. Caffeine use Coffee. No drug use Tobacco use Former smoker.  Family History Marjean Donna, CMA; 10/26/2014 8:40 AM) Bleeding disorder Mother. Cancer Father. Cerebrovascular Accident Mother. Colon Polyps Mother. Hypertension Mother, Sister. Melanoma Mother, Sister. Migraine Headache Sister. Respiratory Condition Father. Thyroid problems Mother, Sister.  Review of Systems Davy Pique Bynum CMA; 10/26/2014 8:40 AM) General Not Present- Appetite Loss, Chills, Fatigue, Fever, Night Sweats, Weight Gain and Weight Loss. Skin Not Present- Change in Wart/Mole, Dryness, Hives, Jaundice, New Lesions, Non-Healing Wounds, Rash and Ulcer. HEENT Present- Wears glasses/contact lenses. Not Present- Earache, Hearing Loss, Hoarseness, Nose Bleed, Oral Ulcers, Ringing in the Ears, Seasonal Allergies, Sinus Pain, Sore Throat, Visual Disturbances and Yellow Eyes. Respiratory Not Present- Bloody sputum, Chronic Cough, Difficulty Breathing, Snoring and Wheezing. Breast Not Present- Breast Mass, Breast Pain, Nipple Discharge and Skin Changes. Cardiovascular Not Present- Chest Pain, Difficulty Breathing Lying Down, Leg Cramps, Palpitations, Rapid Heart Rate, Shortness of Breath and Swelling of Extremities. Gastrointestinal Not Present- Abdominal Pain, Bloating, Bloody Stool, Change in Bowel Habits, Chronic diarrhea, Constipation, Difficulty Swallowing, Excessive gas, Gets full quickly at meals, Hemorrhoids, Indigestion, Nausea, Rectal Pain and Vomiting. Male Genitourinary Present- Change in Urinary Stream. Not Present- Blood in Urine, Frequency, Impotence, Nocturia, Painful  Urination, Urgency and Urine Leakage. Musculoskeletal Present- Back Pain. Not Present- Joint Pain, Joint Stiffness, Muscle Pain, Muscle Weakness and Swelling of Extremities. Neurological Not Present- Decreased Memory, Fainting, Headaches, Numbness, Seizures, Tingling, Tremor, Trouble walking and Weakness. Psychiatric Not Present- Anxiety, Bipolar, Change in Sleep Pattern, Depression,  Fearful and Frequent crying. Endocrine Not Present- Cold Intolerance, Excessive Hunger, Hair Changes, Heat Intolerance, Hot flashes and New Diabetes. Hematology Present- Easy Bruising. Not Present- Excessive bleeding, Gland problems, HIV and Persistent Infections.   Vitals (Sonya Bynum CMA; 10/26/2014 8:41 AM) 10/26/2014 8:41 AM Weight: 187 lb Height: 67in Body Surface Area: 2 m Body Mass Index: 29.29 kg/m Temp.: 97.35F(Temporal)  Pulse: 77 (Regular)  BP: 126/74 (Sitting, Left Arm, Standard)    Physical Exam Gregory Hector MD; 10/26/2014 9:36 AM) General Mental Status-Alert. General Appearance-Not in acute distress, Not Sickly. Orientation-Oriented X3. Hydration-Well hydrated. Voice-Normal.  Integumentary Global Assessment Upon inspection and palpation of skin surfaces of the - Axillae: non-tender, no inflammation or ulceration, no drainage. and Distribution of scalp and body hair is normal. General Characteristics Temperature - normal warmth is noted.  Head and Neck Head-normocephalic, atraumatic with no lesions or palpable masses. Face Global Assessment - atraumatic, no absence of expression. Neck Global Assessment - no abnormal movements, no bruit auscultated on the right, no bruit auscultated on the left, no decreased range of motion, non-tender. Trachea-midline. Thyroid Gland Characteristics - non-tender.  Eye Eyeball - Left-Extraocular movements intact, No Nystagmus. Eyeball - Right-Extraocular movements intact, No Nystagmus. Cornea - Left-No  Hazy. Cornea - Right-No Hazy. Sclera/Conjunctiva - Left-No scleral icterus, No Discharge. Sclera/Conjunctiva - Right-No scleral icterus, No Discharge. Pupil - Left-Direct reaction to light normal. Pupil - Right-Direct reaction to light normal.  ENMT Ears Pinna - Left - no drainage observed, no generalized tenderness observed. Right - no drainage observed, no generalized tenderness observed. Nose and Sinuses External Inspection of the Nose - no destructive lesion observed. Inspection of the nares - Left - quiet respiration. Right - quiet respiration. Mouth and Throat Lips - Upper Lip - no fissures observed, no pallor noted. Lower Lip - no fissures observed, no pallor noted. Nasopharynx - no discharge present. Oral Cavity/Oropharynx - Tongue - no dryness observed. Oral Mucosa - no cyanosis observed. Hypopharynx - no evidence of airway distress observed.  Chest and Lung Exam Inspection Movements - Normal and Symmetrical. Accessory muscles - No use of accessory muscles in breathing. Palpation Palpation of the chest reveals - Non-tender. Auscultation Breath sounds - Normal and Clear.  Cardiovascular Auscultation Rhythm - Regular. Murmurs & Other Heart Sounds - Auscultation of the heart reveals - No Murmurs and No Systolic Clicks.  Abdomen Inspection Inspection of the abdomen reveals - No Visible peristalsis and No Abnormal pulsations. Umbilicus - No Bleeding, No Urine drainage. Palpation/Percussion Palpation and Percussion of the abdomen reveal - Soft, Non Tender, No Rebound tenderness, No Rigidity (guarding) and No Cutaneous hyperesthesia. Note: Soft and nontender nondistended. Moderate diastases recti. No umbilical hernia.   Male Genitourinary Sexual Maturity Tanner 5 - Adult hair pattern and Adult penile size and shape. Note: Normal external male genitalia. No inguinal hernias.   Peripheral Vascular Upper Extremity Inspection - Left - No Cyanotic nailbeds, Not  Ischemic. Right - No Cyanotic nailbeds, Not Ischemic.  Neurologic Neurologic evaluation reveals -normal attention span and ability to concentrate, able to name objects and repeat phrases. Appropriate fund of knowledge , normal sensation and normal coordination. Mental Status Affect - not angry, not paranoid. Cranial Nerves-Normal Bilaterally. Gait-Normal.  Neuropsychiatric Mental status exam performed with findings of-able to articulate well with normal speech/language, rate, volume and coherence, thought content normal with ability to perform basic computations and apply abstract reasoning and no evidence of hallucinations, delusions, obsessions or homicidal/suicidal ideation.  Musculoskeletal Global Assessment Spine, Ribs and Pelvis -  no instability, subluxation or laxity. Right Upper Extremity - no instability, subluxation or laxity.  Lymphatic Head & Neck  General Head & Neck Lymphatics: Bilateral - Description - No Localized lymphadenopathy. Axillary  General Axillary Region: Bilateral - Description - No Localized lymphadenopathy. Femoral & Inguinal  Generalized Femoral & Inguinal Lymphatics: Left - Description - No Localized lymphadenopathy. Right - Description - No Localized lymphadenopathy.    Assessment & Plan ( SESSILE COLONIC POLYP (211.3  K63.5) Impression: Large sessile serrated polyp of ascending colon. Adenomatous changes but no high-grade dysplasia. I gave copy of his colonoscopy report and pathology report.  No new events.  Ready for surgery  I do not think this is a great location for endoscopic mucosal resection. Would benefit from segmental colonic resection. Good candidate for minimally invasive approach. Would plan robotic intracorporeal resection and anastomosis. He is interested in proceeding. I discussed with him. Current Plans  Schedule for Surgery Discussed regular exercise with patient. The anatomy & physiology of the digestive tract was  discussed. The pathophysiology of the colon was discussed. Natural history risks without surgery was discussed. I feel the risks of no intervention will lead to serious problems that outweigh the operative risks; therefore, I recommended a partial colectomy to remove the pathology. Minimally invasive (Robotic/Laparoscopic) & open techniques were discussed.  Risks such as bleeding, infection, abscess, leak, reoperation, possible ostomy, hernia, heart attack, death, and other risks were discussed. I noted a good likelihood this will help address the problem. Goals of post-operative recovery were discussed as well. Need for adequate nutrition, daily bowel regimen and healthy physical activity, to optimize recovery was noted as well. We will work to minimize complications. Educational materials were available as well. Questions were answered. The patient expresses understanding & wishes to proceed with surgery. Pt Education - [facility] Colon Bowel Prep 2015 Miralax/Antibiotics Started Neomycin Sulfate 500MG , 2 (two) Tablet SEE NOTE, #6, 10/26/2014, No Refill. Local Order: TAKE TWO TABLETS AT 2 PM, 3 PM, AND 10 PM THE DAY PRIOR TO SURGERY Started Flagyl 500MG , 2 (two) Tablet SEE NOTE, #6, 10/26/2014, No Refill. Local Order: Take at 2pm, 3pm, and 10pm the day prior to your colon operation Pt Education - [facility] (Ranata Laughery) Pt Education - [facility] Abdominal Surgery (Avah Bashor) Pt Education - [facility] Pain Control (Ngai Parcell)  Gregory Castaneda, M.D., F.A.C.S. Gastrointestinal and Minimally Invasive Surgery [facility] [address], [place], [place] [address] [phone] Main / Paging

## 2015-02-27 NOTE — Discharge Instructions (Signed)
SURGERY: POST OP INSTRUCTIONS (Surgery for small bowel obstruction, colon resection, etc)   1. DIET: Follow a light bland diet the first 24 hours after arrival home, such as soup, liquids, crackers, etc.  Be sure to include lots of fluids daily.  Avoid fast food or heavy meals as your are more likely to get nauseated.  Stay on a low fat diet the next few days after surgery.  Gradually add a fiber supplement to your diet over the next week.   Your should try to eat a low-fat, high fiber diet the rest of your life thereafter (See Below).   2. Take your usually prescribed home medications unless otherwise directed.  OK to take aspirin.    If you are on strong blood thinners (warfarin/Coumadin, Plavix, Xerelto, Eliquis, etc), discuss with your surgeon, medicine PCP, and/or cardiologist for instructions on when to restart the blood thinner & if blood monitoring is needed (PT/INR blood check, etc)  3. PAIN CONTROL:  Pain after surgery or related to activity is often due to strain/injury to muscle, tendon, nerves and/or incisions.  This pain is usually short-term and will improve in a few months.   Many people find it helpful to do the following things TOGETHER to help speed the process of healing and to get back to regular activity more quickly:  1. Avoid heavy physical activity at first a. No lifting greater than 20 pounds at first, then increase to lifting as tolerated over the next few weeks b. Do not push through the pain.  Listen to your body and avoid positions and maneuvers than reproduce the pain.  Wait a few days before trying something more intense c. Walking is okay as tolerated, but go slowly and stop when getting sore.  If you can walk 30 minutes without stopping or pain, you can try more intense activity (running, jogging, aerobics, cycling, swimming, treadmill, sex, sports, weightlifting, etc ) d. Remember: If it hurts to do it, then dont do it!  2. Take Anti-inflammatory  medication a. Choose ONE of the following over-the-counter medications: i.            Acetaminophen 559m tabs (Tylenol) 1-2 pills with every meal and just before bedtime (avoid if you have liver problems) ii.            Naproxen 2222mtabs (ex. Aleve) 1-2 pills twice a day (avoid if you have kidney, stomach, IBD, or bleeding problems) iii. Ibuprofen 20068mabs (ex. Advil, Motrin) 3-4 pills with every meal and just before bedtime (avoid if you have kidney, stomach, IBD, or bleeding problems) b. Take with food/snack around the clock for 1-2 weeks i. This helps the muscle and nerve tissues become less irritable and calm down faster  3. Use a Heating pad or Ice/Cold Pack a. Most patients will experience some swelling and bruising around the incisions.  Swelling and bruising can take several weeks to resolve. i. Ice packs or heating pads (30-60 minutes up to 6 times a day) will help. ii. Use ice for the first few days to help decrease swelling and bruising iii. Switch to heat to help relax tight/sore spots and speed recovery.  iv. Some people prefer to use ice alone, heat alone, alternating between ice & heat.  Experiment to what works for you a. May use warm bath/hottub  or showers  4. Try Gentle Massage and/or Stretching  a. at the area of pain many times a day b. stop if you feel pain - do not  overdo it 5. Prescription for pain medication (such as oxycodone, hydrocodone, etc) should be given to you upon discharge.  Take your pain medication as prescribed. a. If you are having problems/concerns with the prescription medicine (does not control pain, nausea, vomiting, rash, itching, etc), please call us [phone] to see if we need to switch you to a different pain medicine that will work better for you and/or control your side effect better. b. If you need a refill on your pain medication, please contact your pharmacy.  They will contact our office to request authorization. Prescriptions will  not be filled after 5 pm or on week-ends. c.  Try these steps together to help you body heal faster and avoid making things get worse.  Doing just one of these things may not be enough.    If you are not getting better after two weeks or are noticing you are getting worse, contact our office for further advice; we may need to re-evaluate you & see what other things we can do to help.  i.  GETTING TO GOOD BOWEL HEALTH. Irregular bowel habits such as constipation and diarrhea can lead to many problems over time.  Having one soft bowel movement a day is the most important way to prevent further problems.  The anorectal canal is designed to handle stretching and feces to safely manage our ability to get rid of solid waste (feces, poop, stool) out of our body.  BUT, hard constipated stools can act like ripping concrete bricks and diarrhea can be a burning fire to this very sensitive area of our body, causing inflamed hemorrhoids, anal fissures, increasing risk is perirectal abscesses, abdominal pain/bloating, an making irritable bowel worse.      The goal: ONE SOFT BOWEL MOVEMENT A DAY!  To have soft, regular bowel movements:   Drink plenty of fluids, consider 4-6 tall glasses of water a day.    Take plenty of fiber.  Fiber is the undigested part of plant food that passes into the colon, acting s natures broom to encourage bowel motility and movement.  Fiber can absorb and hold large amounts of water. This results in a larger, bulkier stool, which is soft and easier to pass. Work gradually over several weeks up to 6 servings a day of fiber (25g a day even more if needed) in the form of: o Vegetables -- Root (potatoes, carrots, turnips), leafy green (lettuce, salad greens, celery, spinach), or cooked high residue (cabbage, broccoli, etc) o Fruit -- Fresh (unpeeled skin & pulp), Dried (prunes, apricots, cherries, etc ),  or stewed ( applesauce)  o Whole grain breads, pasta, etc (whole wheat)  o Bran  cereals   Bulking Agents -- This type of water-retaining fiber generally is easily obtained each day by one of the following:  o Psyllium bran -- The psyllium plant is remarkable because its ground seeds can retain so much water. This product is available as Metamucil, Konsyl, Effersyllium, Per Diem Fiber, or the less expensive generic preparation in drug and health food stores. Although labeled a laxative, it really is not a laxative.  o Methylcellulose -- This is another fiber derived from wood which also retains water. It is available as Citrucel. o Polyethylene Glycol - and artificial fiber commonly called Miralax or Glycolax.  It is helpful for people with gassy or bloated feelings with regular fiber o Flax Seed - a less gassy fiber than psyllium  No reading or other relaxing activity while on the toilet. If  bowel movements take longer than 5 minutes, you are too constipated  AVOID CONSTIPATION.  High fiber and water intake usually takes care of this.  Sometimes a laxative is needed to stimulate more frequent bowel movements, but   Laxatives are not a good long-term solution as it can wear the colon out.  They can help jump-start bowels if constipated, but should be relied on constantly without discussing with your doctor o Osmotics (Milk of Magnesia, Fleets phosphosoda, Magnesium citrate, MiraLax, GoLytely) are safer than  o Stimulants (Senokot, Castor Oil, Dulcolax, Ex Lax)    o Avoid taking laxatives for more than 7 days in a row.   IF SEVERELY CONSTIPATED, try a Bowel Retraining Program: o Do not use laxatives.  o Eat a diet high in roughage, such as bran cereals and leafy vegetables.  o Drink six (6) ounces of prune or apricot juice each morning.  o Eat two (2) large servings of stewed fruit each day.  o Take one (1) heaping tablespoon of a psyllium-based bulking agent twice a day. Use sugar-free sweetener when possible to avoid excessive calories.  o Eat a normal breakfast.   o Set aside 15 minutes after breakfast to sit on the toilet, but do not strain to have a bowel movement.  o If you do not have a bowel movement by the third day, use an enema and repeat the above steps.   Controlling diarrhea o Switch to liquids and simpler foods for a few days to avoid stressing your intestines further. o Avoid dairy products (especially milk & ice cream) for a short time.  The intestines often can lose the ability to digest lactose when stressed. o Avoid foods that cause gassiness or bloating.  Typical foods include beans and other legumes, cabbage, broccoli, and dairy foods.  Every person has some sensitivity to other foods, so listen to our body and avoid those foods that trigger problems for you. o Adding fiber (Citrucel, Metamucil, psyllium, Miralax) gradually can help thicken stools by absorbing excess fluid and retrain the intestines to act more normally.  Slowly increase the dose over a few weeks.  Too much fiber too soon can backfire and cause cramping & bloating. o Probiotics (such as active yogurt, Align, etc) may help repopulate the intestines and colon with normal bacteria and calm down a sensitive digestive tract.  Most studies show it to be of mild help, though, and such products can be costly. o Medicines: - Bismuth subsalicylate (ex. Kayopectate, Pepto Bismol) every 30 minutes for up to 6 doses can help control diarrhea.  Avoid if pregnant. - Loperamide (Immodium) can slow down diarrhea.  Start with two tablets (51m total) first and then try one tablet every 6 hours.  Avoid if you are having fevers or severe pain.  If you are not better or start feeling worse, stop all medicines and call your doctor for advice o Call your doctor if you are getting worse or not better.  Sometimes further testing (cultures, endoscopy, X-ray studies, bloodwork, etc) may be needed to help diagnose and treat the cause of the diarrhea.  TROUBLESHOOTING IRREGULAR BOWELS 1) Avoid extremes  of bowel movements (no bad constipation/diarrhea) 2) Miralax 17gm mixed in 8oz. water or juice-daily. May use BID as needed.  3) Gas-x,Phazyme, etc. as needed for gas & bloating.  4) Soft,bland diet. No spicy,greasy,fried foods.  5) Prilosec over-the-counter as needed  6) May hold gluten/wheat products from diet to see if symptoms improve.  7)  May  try probiotics (Align, Activa, etc) to help calm the bowels down 7) If symptoms become worse call back immediately.   4. Wash / shower every day.  You may shower over the incision / wound.  Avoid baths until 5 days after surgery.  Continue to shower over incision(s) after the dressing is off.  5. Remove your waterproof bandages 5 days after surgery.  You may leave the incision open to air.  Remove any wicks or ribbons in your wound.  If you have an open wound, please see wound care instructions. You may replace a dressing/Band-Aid to cover the incision for comfort if you wish.  6. ACTIVITIES as tolerated:   a. You may resume regular (light) daily activities beginning the next day--such as daily self-care, walking, climbing stairs--gradually increasing activities as tolerated.  If you can walk 30 minutes without difficulty, it is safe to try more intense activity such as jogging, treadmill, bicycling, low-impact aerobics, swimming, etc. b. Save the most intensive and strenuous activity for last (Usually 3-6 weeks after surgery) such as sit-ups, heavy lifting, contact sports, etc  Refrain from any heavy lifting or straining until you are off narcotics for pain control.   c. DO NOT PUSH THROUGH PAIN.  Let pain be your guide: If it hurts to do something, don't do it.  Pain is your body warning you to avoid that activity for another week until the pain goes down. d. You may drive when you are no longer taking prescription pain medication, you can comfortably wear a seatbelt, and you can safely maneuver your car and apply brakes. e. Dennis Bast may have sexual  intercourse when it is comfortable. If it hurts to do something, don't do it.  7. FOLLOW UP in our office a. Please call [facility] at [phone] to set up an appointment to see your surgeon in the office for a follow-up appointment approximately 2-3 weeks after your surgery. b. Make sure that you call for this appointment the day you arrive home to insure a convenient appointment time.  8. IF YOU HAVE DISABILITY OR FAMILY LEAVE FORMS, BRING THEM TO THE OFFICE FOR PROCESSING.  DO NOT GIVE THEM TO YOUR DOCTOR.   WHEN TO CALL us [phone]: 1. Poor pain control 2. Reactions / problems with new medications (rash/itching, nausea, etc)  3. Fever over 101.5 F (38.5 C) 4. Inability to urinate 5. Nausea and/or vomiting 6. Worsening swelling or bruising 7. Continued bleeding from incision. 8. Increased pain, redness, or drainage from the incision  The clinic staff is available to answer your questions during regular business hours (8:30am-5pm).  Please dont hesitate to call and ask to speak to one of our nurses for clinical concerns.   A surgeon from [facility] is always on call at the hospitals   If you have a medical emergency, go to the nearest emergency room or call 911.    [facility], [place], [place], [place], [place]  [address] ? MAIN: [phone] ? TOLL FREE: [phone] ? FAX [phone] [email or web address]  GETTING TO GOOD BOWEL HEALTH. Irregular bowel habits such as constipation and diarrhea can lead to many problems over time.  Having one soft bowel movement a day is the most important way to prevent further problems.  The anorectal canal is designed to handle stretching and feces to safely manage our ability to get rid of solid waste (feces, poop, stool) out of our body.  BUT, hard constipated stools can  act like ripping concrete bricks and diarrhea can be a burning fire to this very sensitive area of our body,  causing inflamed hemorrhoids, anal fissures, increasing risk is perirectal abscesses, abdominal pain/bloating, an making irritable bowel worse.      The goal: ONE SOFT BOWEL MOVEMENT A DAY!  To have soft, regular bowel movements:   Drink plenty of fluids, consider 4-6 tall glasses of water a day.    Take plenty of fiber.  Fiber is the undigested part of plant food that passes into the colon, acting s natures broom to encourage bowel motility and movement.  Fiber can absorb and hold large amounts of water. This results in a larger, bulkier stool, which is soft and easier to pass. Work gradually over several weeks up to 6 servings a day of fiber (25g a day even more if needed) in the form of: o Vegetables -- Root (potatoes, carrots, turnips), leafy green (lettuce, salad greens, celery, spinach), or cooked high residue (cabbage, broccoli, etc) o Fruit -- Fresh (unpeeled skin & pulp), Dried (prunes, apricots, cherries, etc ),  or stewed ( applesauce)  o Whole grain breads, pasta, etc (whole wheat)  o Bran cereals   Bulking Agents -- This type of water-retaining fiber generally is easily obtained each day by one of the following:  o Psyllium bran -- The psyllium plant is remarkable because its ground seeds can retain so much water. This product is available as Metamucil, Konsyl, Effersyllium, Per Diem Fiber, or the less expensive generic preparation in drug and health food stores. Although labeled a laxative, it really is not a laxative.  o Methylcellulose -- This is another fiber derived from wood which also retains water. It is available as Citrucel. o Polyethylene Glycol - and artificial fiber commonly called Miralax or Glycolax.  It is helpful for people with gassy or bloated feelings with regular fiber o Flax Seed - a less gassy fiber than psyllium  No reading or other relaxing activity while on the toilet. If bowel movements take longer than 5 minutes, you are too constipated  AVOID  CONSTIPATION.  High fiber and water intake usually takes care of this.  Sometimes a laxative is needed to stimulate more frequent bowel movements, but   Laxatives are not a good long-term solution as it can wear the colon out.  They can help jump-start bowels if constipated, but should be relied on constantly without discussing with your doctor o Osmotics (Milk of Magnesia, Fleets phosphosoda, Magnesium citrate, MiraLax, GoLytely) are safer than  o Stimulants (Senokot, Castor Oil, Dulcolax, Ex Lax)    o Avoid taking laxatives for more than 7 days in a row.   IF SEVERELY CONSTIPATED, try a Bowel Retraining Program: o Do not use laxatives.  o Eat a diet high in roughage, such as bran cereals and leafy vegetables.  o Drink six (6) ounces of prune or apricot juice each morning.  o Eat two (2) large servings of stewed fruit each day.  o Take one (1) heaping tablespoon of a psyllium-based bulking agent twice a day. Use sugar-free sweetener when possible to avoid excessive calories.  o Eat a normal breakfast.  o Set aside 15 minutes after breakfast to sit on the toilet, but do not strain to have a bowel movement.  o If you do not have a bowel movement by the third day, use an enema and repeat the above steps.   Controlling diarrhea o Switch to liquids and simpler foods for a few  days to avoid stressing your intestines further. o Avoid dairy products (especially milk & ice cream) for a short time.  The intestines often can lose the ability to digest lactose when stressed. o Avoid foods that cause gassiness or bloating.  Typical foods include beans and other legumes, cabbage, broccoli, and dairy foods.  Every person has some sensitivity to other foods, so listen to our body and avoid those foods that trigger problems for you. o Adding fiber (Citrucel, Metamucil, psyllium, Miralax) gradually can help thicken stools by absorbing excess fluid and retrain the intestines to act more normally.  Slowly increase  the dose over a few weeks.  Too much fiber too soon can backfire and cause cramping & bloating. o Probiotics (such as active yogurt, Align, etc) may help repopulate the intestines and colon with normal bacteria and calm down a sensitive digestive tract.  Most studies show it to be of mild help, though, and such products can be costly. o Medicines: - Bismuth subsalicylate (ex. Kayopectate, Pepto Bismol) every 30 minutes for up to 6 doses can help control diarrhea.  Avoid if pregnant. - Loperamide (Immodium) can slow down diarrhea.  Start with two tablets (4mg  total) first and then try one tablet every 6 hours.  Avoid if you are having fevers or severe pain.  If you are not better or start feeling worse, stop all medicines and call your doctor for advice o Call your doctor if you are getting worse or not better.  Sometimes further testing (cultures, endoscopy, X-ray studies, bloodwork, etc) may be needed to help diagnose and treat the cause of the diarrhea.  TROUBLESHOOTING IRREGULAR BOWELS 1) Avoid extremes of bowel movements (no bad constipation/diarrhea) 2) Miralax 17gm mixed in 8oz. water or juice-daily. May use BID as needed.  3) Gas-x,Phazyme, etc. as needed for gas & bloating.  4) Soft,bland diet. No spicy,greasy,fried foods.  5) Prilosec over-the-counter as needed  6) May hold gluten/wheat products from diet to see if symptoms improve.  7)  May try probiotics (Align, Activa, etc) to help calm the bowels down 7) If symptoms become worse call back immediately.  Managing Pain  Pain after surgery or related to activity is often due to strain/injury to muscle, tendon, nerves and/or incisions.  This pain is usually short-term and will improve in a few months.   Many people find it helpful to do the following things TOGETHER to help speed the process of healing and to get back to regular activity more quickly:  6. Avoid heavy physical activity at first a. No lifting greater than 20 pounds at  first, then increase to lifting as tolerated over the next few weeks b. Do not push through the pain.  Listen to your body and avoid positions and maneuvers than reproduce the pain.  Wait a few days before trying something more intense c. Walking is okay as tolerated, but go slowly and stop when getting sore.  If you can walk 30 minutes without stopping or pain, you can try more intense activity (running, jogging, aerobics, cycling, swimming, treadmill, sex, sports, weightlifting, etc ) d. Remember: If it hurts to do it, then dont do it!  7. Take Anti-inflammatory medication a. Choose ONE of the following over-the-counter medications: i.            Acetaminophen 500mg  tabs (Tylenol) 1-2 pills with every meal and just before bedtime (avoid if you have liver problems) ii.            Naproxen 220mg   tabs (ex. Aleve) 1-2 pills twice a day (avoid if you have kidney, stomach, IBD, or bleeding problems) iii. Ibuprofen 200mg  tabs (ex. Advil, Motrin) 3-4 pills with every meal and just before bedtime (avoid if you have kidney, stomach, IBD, or bleeding problems) b. Take with food/snack around the clock for 1-2 weeks i. This helps the muscle and nerve tissues become less irritable and calm down faster  8. Use a Heating pad or Ice/Cold Pack a. 4-6 times a day b. May use warm bath/hottub  or showers  9. Try Gentle Massage and/or Stretching  a. at the area of pain many times a day b. stop if you feel pain - do not overdo it  Try these steps together to help you body heal faster and avoid making things get worse.  Doing just one of these things may not be enough.    If you are not getting better after two weeks or are noticing you are getting worse, contact our office for further advice; we may need to re-evaluate you & see what other things we can do to help.  Colon Polyps Polyps are lumps of extra tissue growing inside the body. Polyps can grow in the large intestine (colon). Most colon polyps are  noncancerous (benign). However, some colon polyps can become cancerous over time. Polyps that are larger than a pea may be harmful. To be safe, caregivers remove and test all polyps. CAUSES  Polyps form when mutations in the genes cause your cells to grow and divide even though no more tissue is needed. RISK FACTORS There are a number of risk factors that can increase your chances of getting colon polyps. They include:  Being older than 50 years.  Family history of colon polyps or colon cancer.  Long-term colon diseases, such as colitis or Crohn disease.  Being overweight.  Smoking.  Being inactive.  Drinking too much alcohol. SYMPTOMS  Most small polyps do not cause symptoms. If symptoms are present, they may include:  Blood in the stool. The stool may look dark red or black.  Constipation or diarrhea that lasts longer than 1 week. DIAGNOSIS People often do not know they have polyps until their caregiver finds them during a regular checkup. Your caregiver can use 4 tests to check for polyps:  Digital rectal exam. The caregiver wears gloves and feels inside the rectum. This test would find polyps only in the rectum.  Barium enema. The caregiver puts a liquid called barium into your rectum before taking X-rays of your colon. Barium makes your colon look white. Polyps are dark, so they are easy to see in the X-ray pictures.  Sigmoidoscopy. A thin, flexible tube (sigmoidoscope) is placed into your rectum. The sigmoidoscope has a light and tiny camera in it. The caregiver uses the sigmoidoscope to look at the last third of your colon.  Colonoscopy. This test is like sigmoidoscopy, but the caregiver looks at the entire colon. This is the most common method for finding and removing polyps. TREATMENT  Any polyps will be removed during a sigmoidoscopy or colonoscopy. The polyps are then tested for cancer. PREVENTION  To help lower your risk of getting more colon polyps:  Eat plenty of  fruits and vegetables. Avoid eating fatty foods.  Do not smoke.  Avoid drinking alcohol.  Exercise every day.  Lose weight if recommended by your caregiver.  Eat plenty of calcium and folate. Foods that are rich in calcium include milk, cheese, and broccoli. Foods that are rich  in folate include chickpeas, kidney beans, and spinach. HOME CARE INSTRUCTIONS Keep all follow-up appointments as directed by your caregiver. You may need periodic exams to check for polyps. SEEK MEDICAL CARE IF: You notice bleeding during a bowel movement. Document Released: 04/30/2004 Document Revised: 10/27/2011 Document Reviewed: 10/14/2011 [facility] Patient Information 2015 [place], [place]. This information is not intended to replace advice given to you by your health care provider. Make sure you discuss any questions you have with your health care provider.

## 2015-02-27 NOTE — Interval H&P Note (Signed)
History and Physical Interval Note:  02/27/2015 7:11 AM  Gregory Castaneda  has presented today for surgery, with the diagnosis of Colon Polyp  The various methods of treatment have been discussed with the patient and family. After consideration of risks, benefits and other options for treatment, the patient has consented to  Procedure(s) with comments: LAPAROSCOPIC PARTIAL COLECTOMY (N/A) - Request RNFA as a surgical intervention .  The patient's history has been reviewed, patient examined, no change in status, stable for surgery.  I have reviewed the patient's chart and labs.  Questions were answered to the patient's satisfaction.     [name] C.

## 2015-02-27 NOTE — Transfer of Care (Signed)
Immediate Anesthesia Transfer of Care Note  Patient: Gregory Castaneda  Procedure(s) Performed: Procedure(s) with comments: LAPAROSCOPIC PROXIMAL RIGHT COLECTOMY WITH OMENTUM PEXY (N/A) - Request RNFA  Patient Location: PACU  Anesthesia Type:General  Level of Consciousness: awake, alert , oriented and patient cooperative  Airway & Oxygen Therapy: Patient Spontanous Breathing and Patient connected to face mask oxygen  Post-op Assessment: Report given to RN, Post -op Vital signs reviewed and stable and Patient moving all extremities X 4  Post vital signs: stable  Last Vitals:  Filed Vitals:   02/27/15 1039  BP: 131/63  Pulse: 95  Temp:   Resp:     Complications: No apparent anesthesia complications

## 2015-02-27 NOTE — Anesthesia Procedure Notes (Signed)
Procedure Name: Intubation Date/Time: 02/27/2015 7:50 AM Performed by: Lissa Morales Pre-anesthesia Checklist: Patient identified, Emergency Drugs available, Suction available and Patient being monitored Patient Re-evaluated:Patient Re-evaluated prior to inductionOxygen Delivery Method: Circle System Utilized Preoxygenation: Pre-oxygenation with 100% oxygen Intubation Type: IV induction Ventilation: Mask ventilation without difficulty Grade View: Grade II Tube type: Oral Tube size: 7.5 mm Number of attempts: 2 (repositioned mac 4 blade with better visualization, trachea slightly deviated to left) Airway Equipment and Method: Stylet and Oral airway Placement Confirmation: ETT inserted through vocal cords under direct vision,  positive ETCO2 and breath sounds checked- equal and bilateral Secured at: 22 cm Tube secured with: Tape Dental Injury: Teeth and Oropharynx as per pre-operative assessment

## 2015-02-27 NOTE — Op Note (Signed)
02/27/2015  10:36 AM  PATIENT:  Gregory Castaneda  64 y.o. male  Patient Care Team: Christain Sacramento, MD as PCP - General (Family Medicine) Lafayette Dragon, MD as Consulting Physician (Gastroenterology) Collan Boston, MD as Consulting Physician (General Surgery)  PRE-OPERATIVE DIAGNOSIS:  Ascending colon adenomatous polyp unresectable by endoscopy  POST-OPERATIVE DIAGNOSIS:  Ascending colon adenomatous polyp unresectable by endoscopy  PROCEDURE:  Procedure(s): LAPAROSCOPIC PROXIMAL RIGHT COLECTOMY WITH OMENTOPEXY  SURGEON:  Surgeon(s): Laderius Boston, MD Alphonsa Overall, MD Assist  ANESTHESIA:   local and general  EBL:  Total I/O In: 2000 [I.V.:2000] Out: 730 [Urine:580; Blood:150]  Delay start of Pharmacological VTE agent (>24hrs) due to surgical blood loss or risk of bleeding:  no  DRAINS: none   SPECIMEN:  Proximal "right" colon  DISPOSITION OF SPECIMEN:  PATHOLOGY  COUNTS:  YES  PLAN OF CARE: Admit to inpatient   PATIENT DISPOSITION:  PACU - hemodynamically stable.  INDICATION:    Patient with history of colon polyps.  Found to have mass and ascending colon not amenable to complete endoscopic resection.  Tattooed.  Clip notes in distal ascending colon just proximal to the hepatic flexure.  Dr. Olevia Perches requested removal.  I recommended segmental resection:  The anatomy & physiology of the digestive tract was discussed.  The pathophysiology was discussed.  Natural history risks without surgery was discussed.   I worked to give an overview of the disease and the frequent need to have multispecialty involvement.  I feel the risks of no intervention will lead to serious problems that outweigh the operative risks; therefore, I recommended a partial colectomy to remove the pathology.  Laparoscopic & open techniques were discussed.   Risks such as bleeding, infection, abscess, leak, reoperation, possible ostomy, hernia, heart attack, death, and other risks were discussed.  I noted a  good likelihood this will help address the problem.   Goals of post-operative recovery were discussed as well.  We will work to minimize complications.  An educational handout on the pathology was given as well.  Questions were answered.    The patient expresses understanding & wishes to proceed with surgery.  OR FINDINGS:   Patient had small but firm mass in mid ascending colon.  At the area of the tattoo.  Seem to correlate close to where the endoscopic clip had been on post-endoscopic x-ray.  No obvious metastatic disease on visceral parietal peritoneum or liver.  It is an ileocolonic anastomosis that rests in the epigastric region.  DESCRIPTION:   Informed consent was confirmed.  The patient underwent general anaesthesia without difficulty.  The patient was positioned with arms tucked & secured appropriately.  VTE prevention in place.  The patient's abdomen was clipped, prepped, & draped in a sterile fashion.  Surgical timeout confirmed our plan.  The patient was positioned in reverse Trendelenburg.  Abdominal entry was gained using Veress needle technique using trach hook on the anterior abdominal wall fascia for countertension in the left upper abdomen.  Entry was clean.  I induced carbon dioxide insufflation.  Camera inspection revealed no injury.  Extra ports were carefully placed under direct laparoscopic visualization.    I mobilized & reflected the greater omentum and small bowel in the upper abdomen.   I was able to elevate the proximal colon to isolate the ileocolonic pedicle.  I scored the ileal mesentery just proximal to that.   I carried that further dissection in a medial to lateral fashion.  I was able to bluntly get  into the retro-mesenteric plane on the right side.  I freed the proximal right sided colonic mesentery off the retroperitoneum including the duodenal sweep, pancreatic head, & Gerota's fascia of the right kidney. I was able to get underneath the hepatic flexure.  I  was able to get underneath the proximal and mid transverse colon.  did have some small perforating vessels from the pancreatic head to the proximal transverse colon.  Some mild bleeding noted controlled.     I isolated the proximal ileocecal pedicle.  I skeletonized it & transected the vessels.   further skin eyes and transected the right colic pedicle as well  I then proceeded to mobilize the terminal ileum & proximal "right" colon in a lateral to medial fashion.  I mobilized the distal ileal mesentery off its retroperitoneal and pelvic attachments.  somewhat stuck to the right kidney but a definitely clear plane.   I mobilized the ascending colon off It is side wall attachments to the paracolic gutter and retroperitoneum.  I also mobilized the greater omentum off the mid transverse colon and mobilized the mid to proximal transverse colon in a superior to inferior fashion.  This allowed me to mobilize the hepatic flexure and get a complete mobilization of the proximal "right" colon to the mid-transverse colon..  I could isolate the pathology -  small tattoo seen in the mid ascending colon.  We went ahead and proceeded with transection.   transected the distal ileal mesentery radially, preserving a good distal ileal arterial branch.  Transected the proximal/mid transverse colon mesentery in a radial fashion, preserving the middle colic arterial branches.  I transected in the distal ileum with a 60 mm laparoscopic stapler.  Then transected at the proximal transverse colon with another load.  We assured hemostasis.     I did a side-to-side stapled anastomosis of ileum to mid-transverse colon using a 26mm robotic stapler.  I sewed the common staple channel wound with an absorbable suture (2-0 V-lock).  I protected the staple line & V lock closure with an omentopexy of greater omentum.    Late omentum over the staple line.  Brought the V lock sutures through that a few times and tied down.  That help patch the  omentum over the staple line well.  We did reinspection of the abdomen.  Hemostasis was good.   Ureters, retroperitoneum, pancreas, duodenum and bowel uninjured.  The anastomosis looked healthy.   We did a final irrigation of antibiotic solution (900 mg clindamycin/240 mg gentamicin in a liter of crystalloid) & held that while we placed the wound protector through a periumbilical midline incision.  Specimen removed without incident.   Ports and wound protector removed.  The patient was re-draped.  Sterile unused instruments were used from this point out per colon SSI prevention protocol.  Fascia at the 2mm port & extraction site closed with PDS suture.   Skin closed using Monocryl stitch and sterile dressing.  I closed the skin with some interrupted Monocryl stitches. I placed wicks in between those areas. I placed sterile dressing.   Patient is being extubated go to recovery room. I discussed postop care with the patient in detail the office & in the holding area. Instructions are written. I updated the patient's status to his wife.   Recommendations were made.  Questions were answered.  She expressed understanding & appreciation.  [name], M.D., F.A.C.S. Gastrointestinal and Minimally Invasive Surgery [facility] [address], [place], [place]  [address] [phone] Main / Paging

## 2015-02-27 NOTE — Anesthesia Postprocedure Evaluation (Signed)
  Anesthesia Post-op Note  Patient: Gregory Castaneda  Procedure(s) Performed: Procedure(s) (LRB): LAPAROSCOPIC PROXIMAL RIGHT COLECTOMY WITH OMENTUM PEXY (N/A)  Patient Location: PACU  Anesthesia Type: General  Level of Consciousness: awake and alert   Airway and Oxygen Therapy: Patient Spontanous Breathing  Post-op Pain: mild  Post-op Assessment: Post-op Vital signs reviewed, Patient's Cardiovascular Status Stable, Respiratory Function Stable, Patent Airway and No signs of Nausea or vomiting  Last Vitals:  Filed Vitals:   02/27/15 1130  BP: 95/47  Pulse: 73  Temp: 36.7 C  Resp: 17    Post-op Vital Signs: stable   Complications: No apparent anesthesia complications

## 2015-02-28 ENCOUNTER — Encounter (HOSPITAL_COMMUNITY): Payer: Self-pay | Admitting: Surgery

## 2015-02-28 LAB — BASIC METABOLIC PANEL
Anion gap: 8 (ref 5–15)
BUN: 14 mg/dL (ref 6–20)
CO2: 27 mmol/L (ref 22–32)
Calcium: 8.5 mg/dL — ABNORMAL LOW (ref 8.9–10.3)
Chloride: 103 mmol/L (ref 101–111)
Creatinine, Ser: 1.1 mg/dL (ref 0.61–1.24)
GFR calc Af Amer: >60 mL/min (ref >60)
Glucose, Bld: 111 mg/dL — ABNORMAL HIGH (ref 65–99)
Potassium: 4.1 mmol/L (ref 3.5–5.1)
Sodium: 138 mmol/L (ref 135–145)

## 2015-02-28 LAB — MAGNESIUM: Magnesium: 1.8 mg/dL (ref 1.7–2.4)

## 2015-02-28 LAB — CBC
HCT: 37.9 % — ABNORMAL LOW (ref 39.0–52.0)
Hemoglobin: 12.9 g/dL — ABNORMAL LOW (ref 13.0–17.0)
MCH: 31.5 pg (ref 26.0–34.0)
MCHC: 34 g/dL (ref 30.0–36.0)
MCV: 92.7 fL (ref 78.0–100.0)
PLATELETS: 157 10*3/uL (ref 150–400)
RBC: 4.09 MIL/uL — AB (ref 4.22–5.81)
RDW: 12.5 % (ref 11.5–15.5)
WBC: 9.6 10*3/uL (ref 4.0–10.5)

## 2015-02-28 MED ORDER — LACTATED RINGERS IV BOLUS (SEPSIS): 1000.0000 mL | Freq: Three times a day (TID) | INTRAVENOUS | Status: DC | PRN

## 2015-02-28 NOTE — Progress Notes (Signed)
[facility]  [address] [place], [place] [address] Phone: [phone] FAX: [place] [phone] Jul 27, 1951   Problem List:   Principal Problem:   Sessile serrated ascending colon polyp s/p lap colectomy 02/27/2015 Active Problems:   Colon polyps   1 Day Post-Op   POST-OPERATIVE DIAGNOSIS: Ascending colon adenomatous polyp unresectable by endoscopy  PROCEDURE: Procedure(s): LAPAROSCOPIC PROXIMAL RIGHT COLECTOMY WITH OMENTOPEXY  SURGEON: Surgeon(s): Jaquarius Boston, MD Alphonsa Overall, MD Assist  ANESTHESIA: local and general  Assessment  Recovering  Plan:  -adv diet per protocol -OR findings noted -f/u pathology -VTE prophylaxis- SCDs, etc -mobilize as tolerated to help recovery  D/C patient from hospital when patient meets criteria (anticipate in 1-3 day(s)):  Tolerating oral intake well Ambulating well Adequate pain control without IV medications Urinating  Having flatus Disposition planning in place   [name], M.D., F.A.C.S. Gastrointestinal and Minimally Invasive Surgery [facility] [address] [place], [place] [address] [phone] Main / Paging   02/28/2015  Subjective:  Denies pain.  No nausea or vomiting.  Trying full liquid diet.  Having flatus.  Walked in hallways x3  Objective:  Vital signs:  Filed Vitals:   02/27/15 1808 02/27/15 2059 02/28/15 0221 02/28/15 0515  BP: 122/74 109/58 106/62 112/55  Pulse: 95 83 77 80  Temp: 98.4 F (36.9 C) 98.4 F (36.9 C) 98.8 F (37.1 C) 98.3 F (36.8 C)  TempSrc: Oral Oral Oral Oral  Resp: _0 Height:      Weight:      SpO2: 96% 97% 97% 98%    Last BM Date: 02/26/15  Intake/Output   Yesterday:  07/12 0701 - 07/13 0700 In: 6767 [P.O.:720; I.V.:4035] Out: 1630 [Urine:1480; Blood:150] This shift:     Bowel function:  Flatus: y  BM: n  Drain:  n/a  Physical Exam:  General: Pt awake/alert/oriented x4 in no acute distress Eyes: PERRL, normal EOM.  Sclera clear.  No icterus Neuro: CN II-XII intact w/o focal sensory/motor deficits. Lymph: No head/neck/groin lymphadenopathy Psych:  No delerium/psychosis/paranoia HENT: Normocephalic, Mucus membranes moist.  No thrush Neck: Supple, No tracheal deviation Chest: No chest wall pain w good excursion CV:  Pulses intact.  Regular rhythm MS: Normal AROM mjr joints.  No obvious deformity Abdomen: Soft.  Nondistended.  Nontender at incisions.  No evidence of peritonitis.  No incarcerated hernias. Ext:  SCDs BLE.  No mjr edema.  No cyanosis Skin: No petechiae / purpura  Results:   Labs: Results for orders placed or performed during the hospital encounter of 02/27/15 (from the past 48 hour(s))  Type and screen     Status: None   Collection Time: 02/27/15  7:00 AM  Result Value Ref Range   ABO/RH(D) A POS    Antibody Screen NEG    Sample Expiration 03/02/2015   ABO/Rh     Status: None   Collection Time: 02/27/15  7:30 AM  Result Value Ref Range   ABO/RH(D) A POS   Basic metabolic panel     Status: Abnormal   Collection Time: 02/28/15  5:32 AM  Result Value Ref Range   Sodium 138 135 - 145 mmol/L   Potassium 4.1 3.5 - 5.1 mmol/L   Chloride 103 101 - 111 mmol/L   CO2 27 22 - 32 mmol/L   Glucose, Bld 111 (H) 65 - 99 mg/dL   BUN 14 6 - 20 mg/dL  Creatinine, Ser 1.10 0.61 - 1.24 mg/dL   Calcium 8.5 (L) 8.9 - 10.3 mg/dL   GFR calc non Af Amer >60 >60 mL/min   GFR calc Af Amer >60 >60 mL/min    Comment: (NOTE) The eGFR has been calculated using the CKD EPI equation. This calculation has not been validated in all clinical situations. eGFR's persistently <60 mL/min signify possible Chronic Kidney Disease.    Anion gap 8 5 - 15  CBC     Status: Abnormal   Collection Time: 02/28/15  5:32 AM  Result Value Ref Range   WBC 9.6 4.0 - 10.5 K/uL   RBC 4.09 (L) 4.22 - 5.81 MIL/uL    Hemoglobin 12.9 (L) 13.0 - 17.0 g/dL   HCT 37.9 (L) 39.0 - 52.0 %   MCV 92.7 78.0 - 100.0 fL   MCH 31.5 26.0 - 34.0 pg   MCHC 34.0 30.0 - 36.0 g/dL   RDW 12.5 11.5 - 15.5 %   Platelets 157 150 - 400 K/uL  Magnesium     Status: None   Collection Time: 02/28/15  5:32 AM  Result Value Ref Range   Magnesium 1.8 1.7 - 2.4 mg/dL    Imaging / Studies: No results found.  Medications / Allergies: per chart  Antibiotics: Anti-infectives    Start     Dose/Rate Route Frequency Ordered Stop   02/27/15 1400  clindamycin (CLEOCIN) IVPB 900 mg     900 mg 100 mL/hr over 30 Minutes Intravenous 3 times per day 02/27/15 1149 02/27/15 1406   02/27/15 1028  clindamycin (CLEOCIN) 900 mg, gentamicin (GARAMYCIN) 240 mg in sodium chloride 0.9 % 1,000 mL for intraperitoneal lavage  Status:  Discontinued       As needed 02/27/15 1029 02/27/15 1036   02/27/15 0644  clindamycin (CLEOCIN) IVPB 900 mg     900 mg 100 mL/hr over 30 Minutes Intravenous 60 min pre-op 02/27/15 0644 02/27/15 0751   02/27/15 0000  clindamycin (CLEOCIN) 900 mg, gentamicin (GARAMYCIN) 240 mg in sodium chloride 0.9 % 1,000 mL for intraperitoneal lavage  Status:  Discontinued    Comments:  Pharmacy may adjust dosing strength, schedule, rate of infusion, etc as needed to optimize therapy    Intraperitoneal To Surgery 02/26/15 1330 02/27/15 1149   02/27/15 0000  gentamicin (GARAMYCIN) 360 mg in dextrose 5 % 100 mL IVPB     360 mg 109 mL/hr over 60 Minutes Intravenous 60 min pre-op 02/26/15 1341 02/27/15 0809        Note: Portions of this report may have been transcribed using voice recognition software. Every effort was made to ensure accuracy; however, inadvertent computerized transcription errors may be present.   Any transcriptional errors that result from this process are unintentional.     [name], M.D., F.A.C.S. Gastrointestinal and Minimally Invasive Surgery [facility] [address] [place], [place] [address] [phone] Main / Paging   02/28/2015  CARE TEAM:  PCP: Woody Seller, MD  Outpatient Care Team: Patient Care Team: Christain Sacramento, MD as PCP - General (Family Medicine) Lafayette Dragon, MD as Consulting Physician (Gastroenterology) Ketrick Boston, MD as Consulting Physician (General Surgery)  Inpatient Treatment Team: Treatment Team: Attending Provider: Aldon Boston, MD; Technician: Enis Gash, NT; Registered Nurse: Darlyn Chamber, RN

## 2015-02-28 NOTE — Progress Notes (Signed)
Pharmacy Brief Note - Alvimopan (Entereg)  The standing order set for alvimopan (Entereg) now includes an automatic order to discontinue the drug after the patient has had a bowel movement. The change was approved by the [place] and the Medical Executive Committee.  This patient has had a bowel movement documented by nursing. Therefore, alvimopan has been discontinued. If there are questions, please contact the pharmacy at [phone].  Thank you,  Reuel Boom, PharmD, BCPS Pager: [phone] 02/28/2015, 1:31 PM

## 2015-03-01 MED ORDER — SODIUM CHLORIDE 0.9 % IV SOLN: 250.0000 mL | INTRAVENOUS | Status: DC | PRN

## 2015-03-01 MED ORDER — LACTATED RINGERS IV BOLUS (SEPSIS): 1000.0000 mL | Freq: Three times a day (TID) | INTRAVENOUS | Status: DC | PRN

## 2015-03-01 MED ORDER — HYDROMORPHONE HCL 1 MG/ML IJ SOLN: 0.5000 mg | INTRAMUSCULAR | Status: DC | PRN

## 2015-03-01 MED ORDER — ACETAMINOPHEN 325 MG PO TABS: 325.0000 mg | ORAL_TABLET | Freq: Four times a day (QID) | ORAL | Status: DC | PRN

## 2015-03-01 MED ORDER — NAPROXEN 500 MG PO TABS: 500.0000 mg | ORAL_TABLET | Freq: Three times a day (TID) | ORAL | Status: DC | PRN

## 2015-03-01 MED ORDER — NAPROXEN 500 MG PO TABS
500.0000 mg | ORAL_TABLET | Freq: Two times a day (BID) | ORAL | Status: DC
  Administered 2015-03-01: 500 mg via ORAL
  Filled 2015-03-01 (×3): qty 1

## 2015-03-01 MED ORDER — SODIUM CHLORIDE 0.9 % IJ SOLN: 3.0000 mL | INTRAMUSCULAR | Status: DC | PRN

## 2015-03-01 MED ORDER — OXYCODONE HCL 5 MG PO TABS
5.0000 mg | ORAL_TABLET | ORAL | Status: DC | PRN
  Administered 2015-03-01: 5 mg via ORAL
  Filled 2015-03-01: qty 1

## 2015-03-01 MED ORDER — SODIUM CHLORIDE 0.9 % IJ SOLN: 3.0000 mL | Freq: Two times a day (BID) | INTRAMUSCULAR | Status: DC

## 2015-03-01 NOTE — Discharge Summary (Signed)
Physician Discharge Summary  Patient ID: Gregory Castaneda MRN: [id number] DOB/AGE: 11-23-1950 64 y.o.  Admit date: 02/27/2015 Discharge date: 03/01/2015  Patient Care Team: Christain Sacramento, MD as PCP - General (Family Medicine) Lafayette Dragon, MD as Consulting Physician (Gastroenterology) Waverly Boston, MD as Consulting Physician (General Surgery)  Admission Diagnoses: Principal Problem:   Sessile serrated ascending colon polyp s/p lap colectomy 02/27/2015 Active Problems:   Colon polyps   Discharge Diagnoses:  Principal Problem:   Sessile serrated ascending colon polyp s/p lap colectomy 02/27/2015 Active Problems:   Colon polyps  SURGERY 02/27/2015  POST-OPERATIVE DIAGNOSIS: Ascending colon adenomatous polyp unresectable by endoscopy  PROCEDURE: Procedure(s): LAPAROSCOPIC PROXIMAL RIGHT COLECTOMY WITH OMENTOPEXY  SURGEON: Surgeon(s): Santi Boston, MD Alphonsa Overall, MD Assist  Colon, segmental resection for tumor, proximal right colon - 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 SEE COMMENT. - ENDOSCOPIC TATTOO INK IDENTIFIED. - BENIGN FIBROUS OBLITERATION OF APPENDIX. - BENIGN COLONIC DIVERTICULA; NEGATIVE FOR DYSPLASIA OR MALIGNANCY. - TWELVE LYMPH NODES, NEGATIVE FOR TUMOR (0/12). - SURGICAL MARGINS, NEGATIVE FOR DYSPLASIA OR MALIGNANCY.  Consults: None  Hospital Course:   The patient underwent the surgery above.  Postoperatively, the patient gradually mobilized and advanced to a solid diet.  Pain and other symptoms were treated aggressively.    By the time of discharge, the patient was walking well the hallways, eating food, having flatus.  Pain was well-controlled on an oral medications.  Based on meeting discharge criteria and continuing to recover, I felt it was safe for the patient to be discharged from the hospital to further recover with close followup. Pathology c/w polyp & no cancer - d/w patient.  Printout of pathology report  given to patient.  Postoperative recommendations were discussed in detail.  They are written as well.   Significant Diagnostic Studies:  Results for orders placed or performed during the hospital encounter of 02/27/15 (from the past 72 hour(s))  Type and screen     Status: None   Collection Time: 02/27/15  7:00 AM  Result Value Ref Range   ABO/RH(D) A POS    Antibody Screen NEG    Sample Expiration 03/02/2015   ABO/Rh     Status: None   Collection Time: 02/27/15  7:30 AM  Result Value Ref Range   ABO/RH(D) A POS   Basic metabolic panel     Status: Abnormal   Collection Time: 02/28/15  5:32 AM  Result Value Ref Range   Sodium 138 135 - 145 mmol/L   Potassium 4.1 3.5 - 5.1 mmol/L   Chloride 103 101 - 111 mmol/L   CO2 27 22 - 32 mmol/L   Glucose, Bld 111 (H) 65 - 99 mg/dL   BUN 14 6 - 20 mg/dL   Creatinine, Ser 1.10 0.61 - 1.24 mg/dL   Calcium 8.5 (L) 8.9 - 10.3 mg/dL   GFR calc non Af Amer >60 >60 mL/min   GFR calc Af Amer >60 >60 mL/min    Comment: (NOTE) The eGFR has been calculated using the CKD EPI equation. This calculation has not been validated in all clinical situations. eGFR's persistently <60 mL/min signify possible Chronic Kidney Disease.    Anion gap 8 5 - 15  CBC     Status: Abnormal   Collection Time: 02/28/15  5:32 AM  Result Value Ref Range   WBC 9.6 4.0 - 10.5 K/uL   RBC 4.09 (L) 4.22 - 5.81 MIL/uL   Hemoglobin 12.9 (L) 13.0 -  17.0 g/dL   HCT 37.9 (L) 39.0 - 52.0 %   MCV 92.7 78.0 - 100.0 fL   MCH 31.5 26.0 - 34.0 pg   MCHC 34.0 30.0 - 36.0 g/dL   RDW 12.5 11.5 - 15.5 %   Platelets 157 150 - 400 K/uL  Magnesium     Status: None   Collection Time: 02/28/15  5:32 AM  Result Value Ref Range   Magnesium 1.8 1.7 - 2.4 mg/dL    No results found.  Discharge Exam: Blood pressure 134/69, pulse 79, temperature 98 F (36.7 C), temperature source Oral, resp. rate 18, height '5\' 8"'  (1.727 m), weight 87.68 kg (193 lb 4.8 oz), SpO2 99 %.  General: Pt  awake/alert/oriented x4 in no major acute distress Eyes: PERRL, normal EOM. Sclera nonicteric Neuro: CN II-XII intact w/o focal sensory/motor deficits. Lymph: No head/neck/groin lymphadenopathy Psych:  No delerium/psychosis/paranoia HENT: Normocephalic, Mucus membranes moist.  No thrush Neck: Supple, No tracheal deviation Chest: No pain.  Good respiratory excursion. CV:  Pulses intact.  Regular rhythm MS: Normal AROM mjr joints.  No obvious deformity Abdomen: Soft, Nondistended.  Mild left sided TTPNontender.  No incarcerated hernias. Ext:  SCDs BLE.  No significant edema.  No cyanosis Skin: No petechiae / purpura  Discharged Condition: good   Past Medical History  Diagnosis Date  . Lumbar herniated disc     lumbar with tingling and pain left leg when "acts up"  . Hypertension   . Enlarged prostate   . 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 Adenomatous colon polyp     tubular  . PONV (postoperative nausea and vomiting)     post  fistula repair  . History of kidney stones   . Urgency of urination   . Heart murmur     AS CHILD - NO PROBLEM AS ADULT  . Palpitations     infrequently  . Arthritis   . History of skin cancer     basal cell    Past Surgical History  Procedure Laterality Date  . Av fistula repair  2005    dr deveshwar  . Moles removed      from face  . Colonoscopy w/ biopsies    . Laparoscopic partial colectomy N/A 02/27/2015    Procedure: LAPAROSCOPIC PROXIMAL RIGHT COLECTOMY WITH OMENTUM PEXY;  Surgeon: Christie Boston, MD;  Location: [facility];  Service: General;  Laterality: N/A;  Request RNFA    History   Social History  . Marital Status: Married    Spouse Name: N/A  . Number of Children: 2  . Years of Education: N/A   Occupational History  . self employed    Social History Main Topics  . Smoking status: Former Smoker    Types: Cigarettes    Quit date: 08/18/1988  . Smokeless tobacco: Former Systems developer     Types: Chew    Quit date: 08/18/1988  . Alcohol Use: 4.8 - 6.0 oz/week    4-5 Glasses of wine, 4-5 Standard drinks or equivalent per week  . Drug Use: No  . Sexual Activity: Not on file   Other Topics Concern  . Not on file   Social History Narrative    Family History  Problem Relation Age of Onset  . Colon polyps Mother   . Skin cancer Mother   . Lung cancer Father   . Skin cancer Sister     x 2  . Dementia Mother   .  Clotting disorder Mother     Current Facility-Administered Medications  Medication Dose Route Frequency Provider Last Rate Last Dose  . 0.9 %  sodium chloride infusion  250 mL Intravenous PRN Salomon Boston, MD      . acetaminophen (TYLENOL) tablet 325-650 mg  325-650 mg Oral Q6H PRN Adhrit Boston, MD      . alum & mag hydroxide-simeth (MAALOX/MYLANTA) 200-200-20 MG/5ML suspension 30 mL  30 mL Oral Q6H PRN Dontrell Boston, MD      . diphenhydrAMINE (BENADRYL) 12.5 MG/5ML elixir 12.5 mg  12.5 mg Oral Q6H PRN Lindon Boston, MD       Or  . diphenhydrAMINE (BENADRYL) injection 12.5 mg  12.5 mg Intravenous Q6H PRN Jazion Boston, MD      . heparin injection 5,000 Units  5,000 Units Subcutaneous 3 times per day Ibrahima Boston, MD   5,000 Units at 03/01/15 [phone]  . HYDROmorphone (DILAUDID) injection 0.5-2 mg  0.5-2 mg Intravenous Q2H PRN Amad Boston, MD      . lactated ringers bolus 1,000 mL  1,000 mL Intravenous Q8H PRN Dino Boston, MD      . lactated ringers bolus 1,000 mL  1,000 mL Intravenous Q8H PRN Daemien Boston, MD      . lactated ringers bolus 1,000 mL  1,000 mL Intravenous Q8H PRN Joeseph Boston, MD      . lip balm (CARMEX) ointment 1 application  1 application Topical BID Denham Boston, MD   1 application at 46/28/63 2254  . magic mouthwash  15 mL Oral QID PRN Braxxton Boston, MD      . menthol-cetylpyridinium (CEPACOL) lozenge 3 mg  1 lozenge Oral PRN Joaquin Boston, MD      . metoprolol tartrate (LOPRESSOR) tablet 12.5 mg  12.5 mg Oral BID PRN Bane Boston, MD       Or  .  metoprolol (LOPRESSOR) injection 5 mg  5 mg Intravenous Q6H PRN Adrian Boston, MD      . multivitamin with minerals tablet 1 tablet  1 tablet Oral Daily Petar Boston, MD   1 tablet at 02/28/15 8177  . naproxen (NAPROSYN) tablet 500 mg  500 mg Oral BID WC Loreto Boston, MD      . ondansetron [facility]) tablet 4 mg  4 mg Oral Q6H PRN Tao Boston, MD       Or  . ondansetron [facility]) injection 4 mg  4 mg Intravenous Q6H PRN Mayan Boston, MD      . oxyCODONE (Oxy IR/ROXICODONE) immediate release tablet 5-10 mg  5-10 mg Oral Q4H PRN Eugune Boston, MD      . phenol (CHLORASEPTIC) mouth spray 2 spray  2 spray Mouth/Throat PRN Omeed Boston, MD      . promethazine (PHENERGAN) injection 6.25-12.5 mg  6.25-12.5 mg Intravenous Q4H PRN Megan Boston, MD      . saccharomyces boulardii (FLORASTOR) capsule 250 mg  250 mg Oral BID Kerby Boston, MD   250 mg at 02/28/15 2253  . sodium chloride 0.9 % injection 3 mL  3 mL Intravenous Q12H Alazar Boston, MD      . sodium chloride 0.9 % injection 3 mL  3 mL Intravenous PRN Zykeem Boston, MD      . tamsulosin [facility]) capsule 0.4 mg  0.4 mg Oral Daily Arsalan Boston, MD   0.4 mg at 02/28/15 1165     Allergies  Allergen Reactions  . Penicillins Swelling and Rash    Rash, feet itching, swelling of mouth  Disposition:   Discharge Instructions    Call MD for:  extreme fatigue    Complete by:  As directed      Call MD for:  extreme fatigue    Complete by:  As directed      Call MD for:  hives    Complete by:  As directed      Call MD for:  hives    Complete by:  As directed      Call MD for:  persistant nausea and vomiting    Complete by:  As directed      Call MD for:  persistant nausea and vomiting    Complete by:  As directed      Call MD for:  redness, tenderness, or signs of infection (pain, swelling, redness, odor or green/yellow discharge around incision site)    Complete by:  As directed      Call MD for:  redness, tenderness, or signs of infection (pain,  swelling, redness, odor or green/yellow discharge around incision site)    Complete by:  As directed      Call MD for:  severe uncontrolled pain    Complete by:  As directed      Call MD for:  severe uncontrolled pain    Complete by:  As directed      Call MD for:    Complete by:  As directed   Temperature > 101.82F     Call MD for:    Complete by:  As directed   Temperature > 101.82F     Diet - low sodium heart healthy    Complete by:  As directed      Diet - low sodium heart healthy    Complete by:  As directed      Discharge instructions    Complete by:  As directed   Please see discharge instruction sheets.  Also refer to handout given an office.  Please call our office if you have any questions or concerns [phone]     Discharge instructions    Complete by:  As directed   Please see discharge instruction sheets.  Also refer to handout given an office.  Please call our office if you have any questions or concerns [phone]     Discharge wound care:    Complete by:  As directed   If you have closed incisions, shower and bathe over these incisions with soap and water every day.  Remove all surgical dressings on postoperative day #3.  You do not need to replace dressings over the closed incisions unless you feel more comfortable with a Band-Aid covering it.   If you have an open wound that requires packing, please see wound care instructions.  In general, remove all dressings, wash wound with soap and water and then replace with saline moistened gauze.  Do the dressing change at least every day.  Please call our office [phone] if you have further questions.     Discharge wound care:    Complete by:  As directed   If you have closed incisions, shower and bathe over these incisions with soap and water every day.  Remove all surgical dressings on postoperative day #3.  You do not need to replace dressings over the closed incisions unless you feel more comfortable with a  Band-Aid covering it.   If you have an open wound that requires packing, please see wound care instructions.  In general, remove all dressings, wash  wound with soap and water and then replace with saline moistened gauze.  Do the dressing change at least every day.  Please call our office [phone] if you have further questions.     Driving Restrictions    Complete by:  As directed   No driving until off narcotics and can safely swerve away without pain during an emergency     Driving Restrictions    Complete by:  As directed   No driving until off narcotics and can safely swerve away without pain during an emergency     Increase activity slowly    Complete by:  As directed   Walk an hour a day.  Use 20-30 minute walks.  When you can walk 30 minutes without difficulty, it is fine to restart low impact/moderate activities such as biking, jogging, swimming, sexual activity, etc.  Eventually you can increase to unrestricted activity when not feeling pain.  If you feel pain: STOP!Marland Kitchen   Let pain protect you from overdoing it.  Use ice/heat & over-the-counter pain medications to help minimize soreness.  If that is not enough, then use your narcotic pain prescription as needed to remain active.  It is better to take extra pain medications and be more active than to stay bedridden to avoid all pain medications.     Increase activity slowly    Complete by:  As directed   Walk an hour a day.  Use 20-30 minute walks.  When you can walk 30 minutes without difficulty, it is fine to restart low impact/moderate activities such as biking, jogging, swimming, sexual activity, etc.  Eventually you can increase to unrestricted activity when not feeling pain.  If you feel pain: STOP!Marland Kitchen   Let pain protect you from overdoing it.  Use ice/heat & over-the-counter pain medications to help minimize soreness.  If that is not enough, then use your narcotic pain prescription as needed to remain active.  It is better to take extra  pain medications and be more active than to stay bedridden to avoid all pain medications.     Lifting restrictions    Complete by:  As directed   Avoid heavy lifting initially.  Do not push through pain.  You have no specific weight limit - if it hurts to do, DON'T DO IT.   If you feel no pain, you are not injuring anything.  Pain will protect you from injury.  Coughing and sneezing are far more stressful to your incision than any lifting.  Avoid resuming heavy lifting / intense activity until off all narcotic pain medications.  When ready to exercise more, give yourself 2 weeks to gradually get back to full intense exercise/activity.     Lifting restrictions    Complete by:  As directed   Avoid heavy lifting initially.  Do not push through pain.  You have no specific weight limit - if it hurts to do, DON'T DO IT.   If you feel no pain, you are not injuring anything.  Pain will protect you from injury.  Coughing and sneezing are far more stressful to your incision than any lifting.  Avoid resuming heavy lifting / intense activity until off all narcotic pain medications.  When ready to exercise more, give yourself 2 weeks to gradually get back to full intense exercise/activity.     May shower / Bathe    Complete by:  As directed      May shower / Bathe    Complete by:  As directed  May walk up steps    Complete by:  As directed      May walk up steps    Complete by:  As directed      Sexual Activity Restrictions    Complete by:  As directed   Sexual activity as tolerated.  Do not push through pain.  Pain will protect you from injury.     Sexual Activity Restrictions    Complete by:  As directed   Sexual activity as tolerated.  Do not push through pain.  Pain will protect you from injury.     Walk with assistance    Complete by:  As directed   Walk over an hour a day.  May use a walker/cane/companion to help with balance and stamina.     Walk with assistance    Complete by:  As directed    Walk over an hour a day.  May use a walker/cane/companion to help with balance and stamina.            Medication List    TAKE these medications        hydrochlorothiazide 25 MG tablet  Commonly known as:  HYDRODIURIL  Take 12.5 mg by mouth daily.     multivitamin tablet  Take 1 tablet by mouth daily.     naproxen 500 MG tablet  Commonly known as:  NAPROSYN  Take 1 tablet (500 mg total) by mouth every 8 (eight) hours as needed.     oxyCODONE 5 MG immediate release tablet  Commonly known as:  Oxy IR/ROXICODONE  Take 1-2 tablets (5-10 mg total) by mouth every 4 (four) hours as needed for moderate pain, severe pain or breakthrough pain.     tamsulosin 0.4 MG Caps capsule  Commonly known as:  FLOMAX  Take 0.4 mg by mouth daily.           Follow-up Information    Follow up with Trinity Hyland C., MD. Schedule an appointment as soon as possible for a visit in 2 weeks.   Specialty:  General Surgery   Why:  To follow up after your operation, To follow up after your hospital stay   Contact information:   [address] [place] [address] [phone]        Signed: Morton Peters, M.D., F.A.C.S. Gastrointestinal and Minimally Invasive Surgery [facility] [address], [place], [place] [address] [phone] Main / Paging   03/01/2015, 8:01 AM

## 2016-05-26 IMAGING — CR DG ABDOMEN 1V
2 series · 2 of 2 positions shown · non-contrast
Comparison: None.

CLINICAL DATA: Status post colonoscopy, verified location of clips

EXAM:
ABDOMEN - 1 VIEW

[view not recorded (1 of 2)]
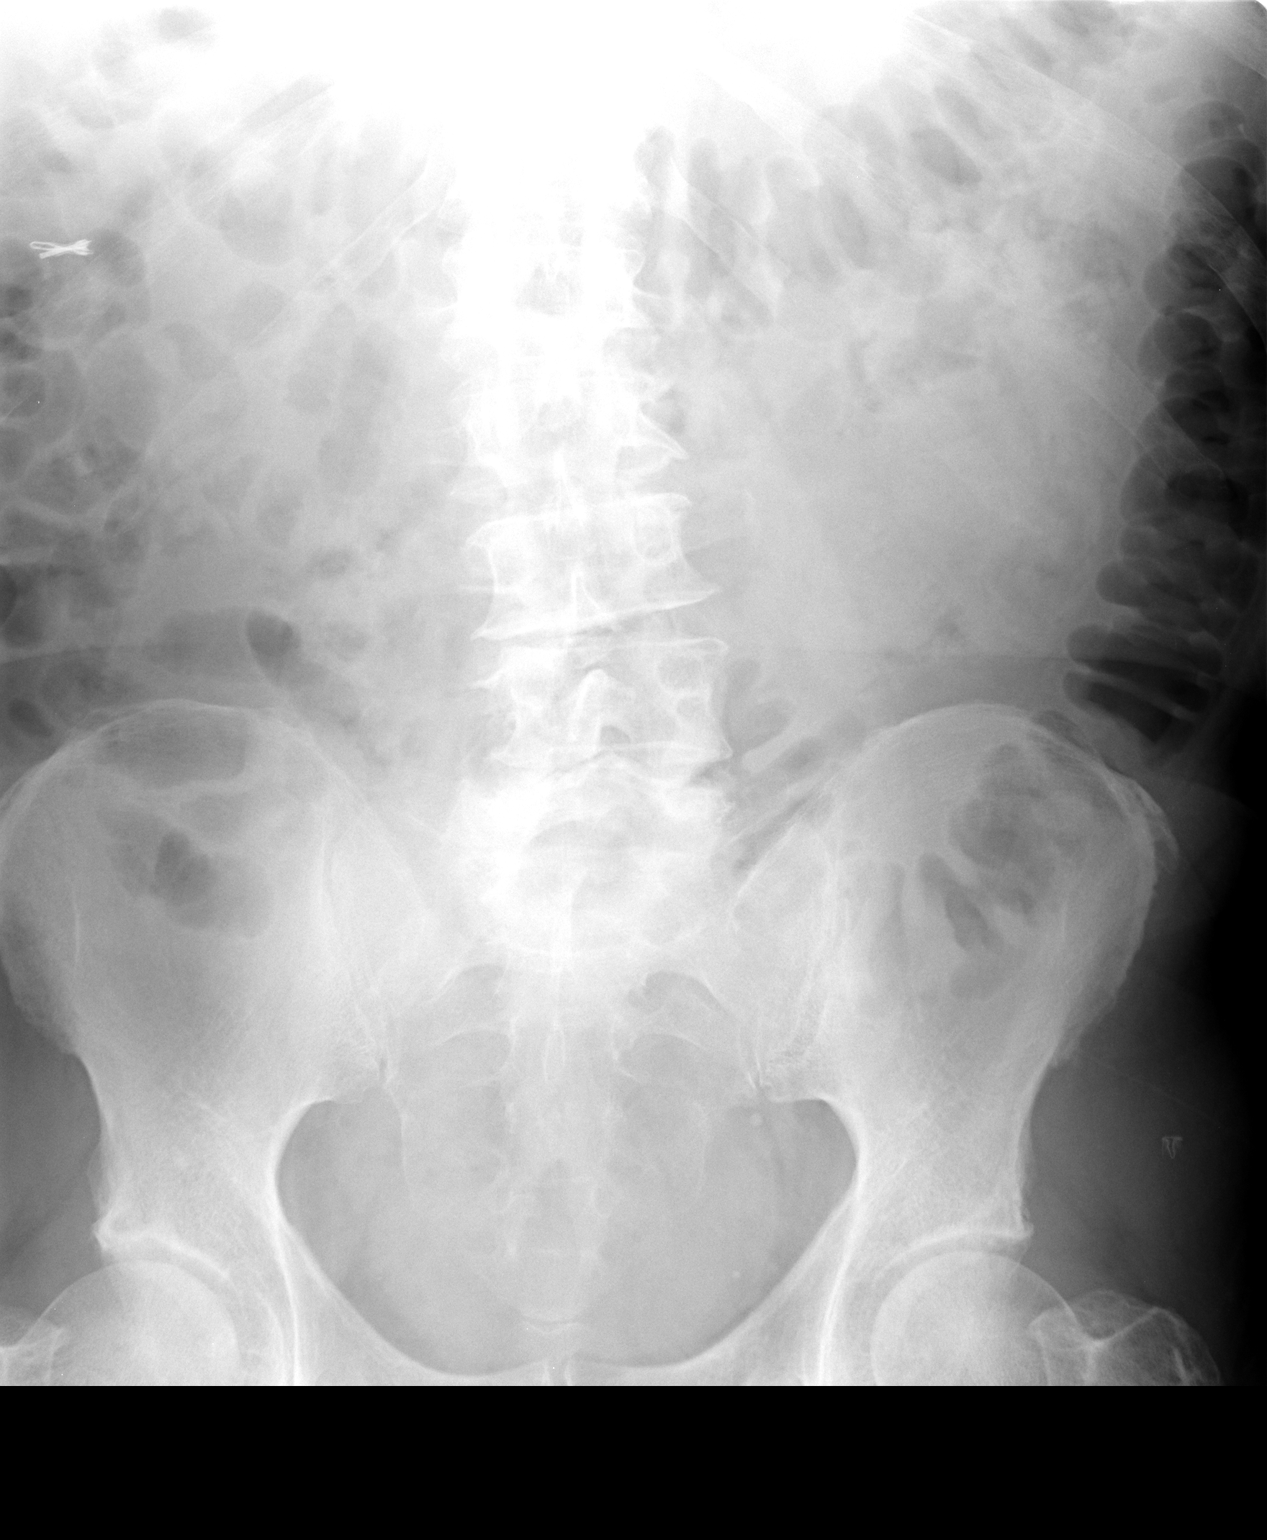

[view not recorded (2 of 2)]
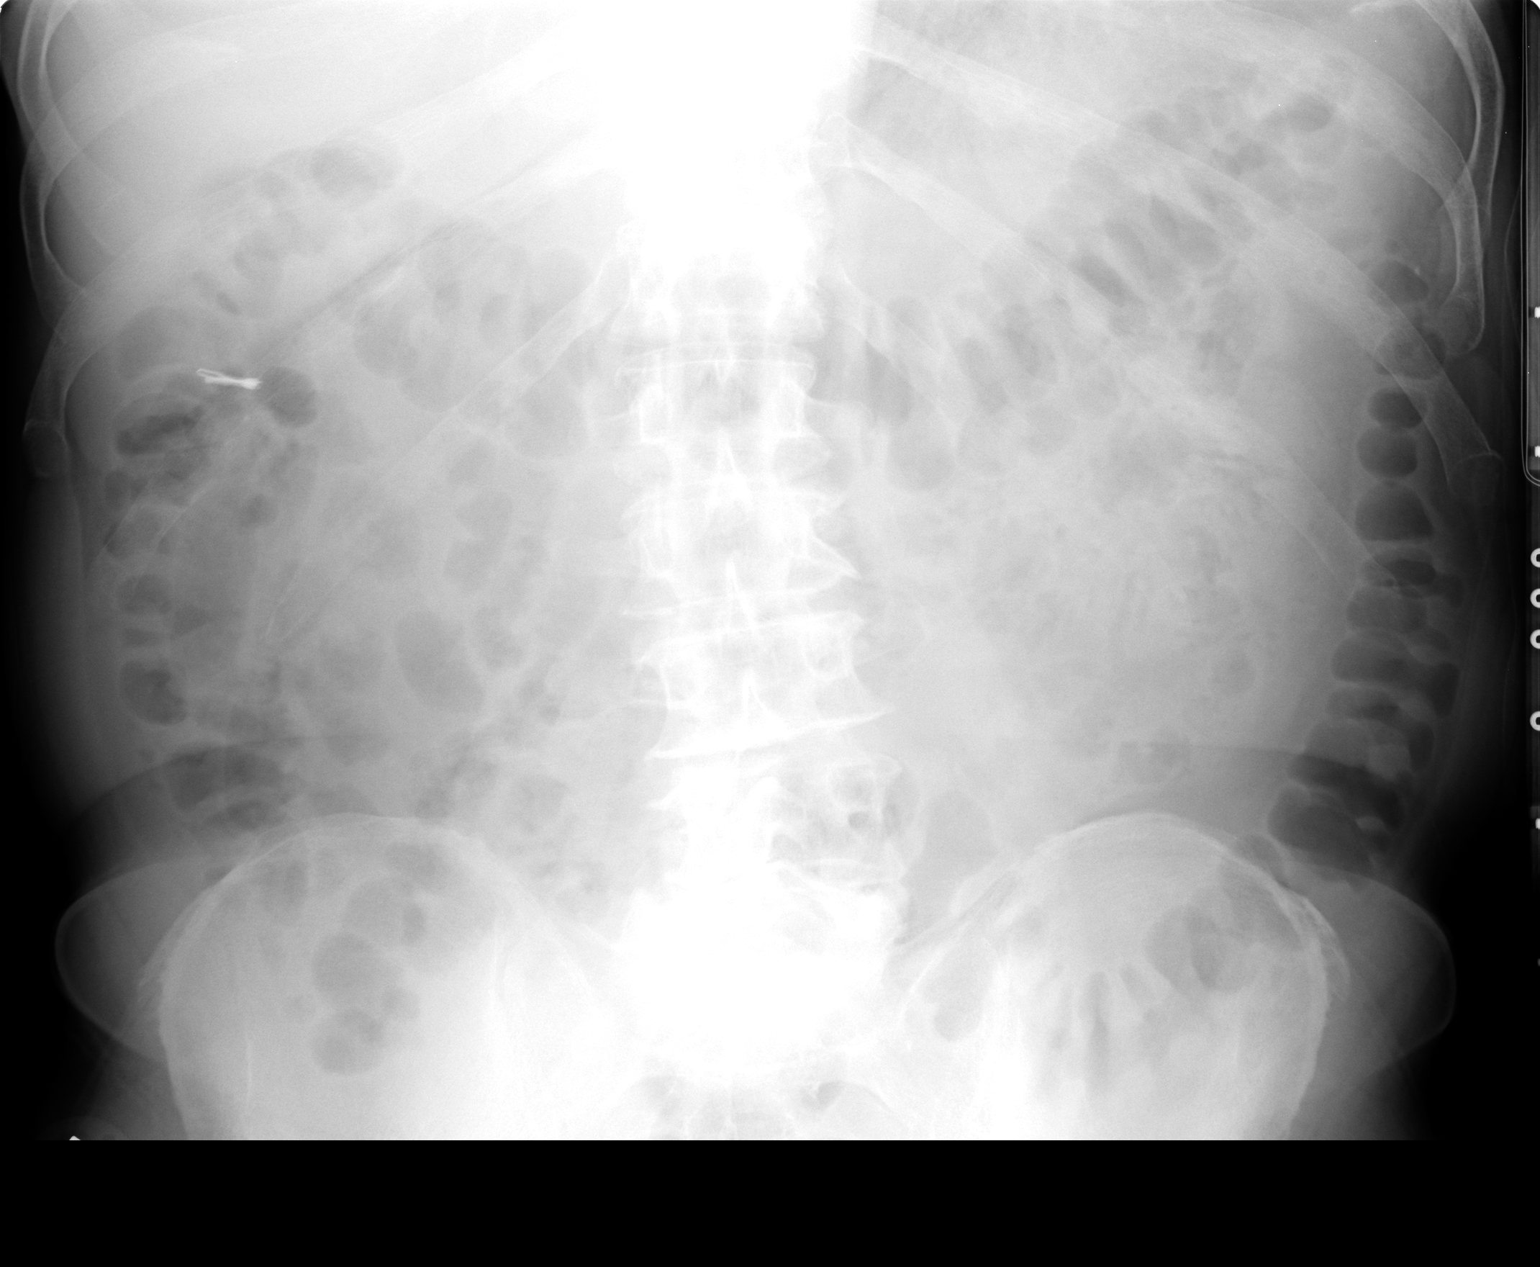

[2 of 2 positions shown; findings below may reference images not displayed]

FINDINGS: Two supine images of the abdomen are reviewed. There are metallic
clips in the proximal hepatic flexure region of the colon. No clips
are noted elsewhere. A moderate amount of gas is noted within normal
calibered large bowel. There are degenerative changes of the lower
lumbar spine.
IMPRESSION: The surgical clips overlie the proximal portion of the hepatic
flexure of the colon.

## 2016-08-18 ENCOUNTER — Ambulatory Visit (HOSPITAL_COMMUNITY)
Admission: EM | Admit: 2016-08-18 | Discharge: 2016-08-18 | Disposition: A | Discharge status: Home / Self Care | Payer: Medicare Other | Attending: Internal Medicine | Admitting: Internal Medicine

## 2016-08-18 ENCOUNTER — Encounter (HOSPITAL_COMMUNITY): Payer: Self-pay | Admitting: Emergency Medicine

## 2016-08-18 DIAGNOSIS — J019 Acute sinusitis, unspecified: Secondary | ICD-10-CM

## 2016-08-18 DIAGNOSIS — R69 Illness, unspecified: Secondary | ICD-10-CM

## 2016-08-18 DIAGNOSIS — J111 Influenza due to unidentified influenza virus with other respiratory manifestations: Secondary | ICD-10-CM

## 2016-08-18 MED ORDER — LEVOFLOXACIN 500 MG PO TABS
500.0000 mg | ORAL_TABLET | Freq: Every day | ORAL | 0 refills | Status: AC
Start: 2016-08-18 — End: 2016-08-28

## 2016-08-18 MED ORDER — PREDNISONE 50 MG PO TABS: 50.0000 mg | ORAL_TABLET | Freq: Every day | ORAL | 0 refills | Status: DC

## 2016-08-18 MED ORDER — BENZONATATE 200 MG PO CAPS: 200.0000 mg | ORAL_CAPSULE | Freq: Three times a day (TID) | ORAL | 1 refills | Status: DC | PRN

## 2016-08-18 MED ORDER — OSELTAMIVIR PHOSPHATE 75 MG PO CAPS: 75.0000 mg | ORAL_CAPSULE | Freq: Two times a day (BID) | ORAL | 0 refills | Status: DC

## 2016-08-18 NOTE — Discharge Instructions (Addendum)
Suspect sinusitis with superimposed viral infection like flu.  Prescriptions sent to [facility] on [address].  Recheck for persistent fever >100.5, increasing phlegm production/nasal discharge, or if not starting to improve in a few days.  Cough may take a couple weeks to subside.

## 2016-08-18 NOTE — ED Triage Notes (Signed)
The patient presented to the [facility] with a complaint of a cough and congestion x 1 month. The patient reported a fever of 102.f yesterday. The patient reported Ibuprofen at 5am this date.

## 2016-08-18 NOTE — ED Provider Notes (Signed)
[facility]    CSN: [id number] Arrival date & time: 08/18/16  1127     History   Chief Complaint Chief Complaint  Patient presents with  . Cough    HPI Gregory Castaneda is a 66 y.o. male. He presents today with a one-month history of sinus congestion, nasal discharge, productive cough. Symptoms have waxed and waned a couple of times. He had the sudden onset of fever to 102 last night, with malaise, prostration, headache. Increased dry cough.   HPI  Past Medical History:  Diagnosis Date  . Adenomatous colon polyp    tubular  . Arthritis   . AV fistula ([place]) 05/2003   endovascularembolization of right carotid arterial branches  . Enlarged prostate   . Heart murmur    AS CHILD - NO PROBLEM AS ADULT  . History of kidney stones   . History of skin cancer    basal cell  . Hypertension   . Lumbar herniated disc    lumbar with tingling and pain left leg when "acts up"  . Palpitations    infrequently  . PONV (postoperative nausea and vomiting)    post  fistula repair  . Urgency of urination     Patient Active Problem List   Diagnosis Date Noted  . Sessile serrated ascending colon polyp s/p lap colectomy 02/27/2015 02/27/2015  . Colon polyps 02/27/2015    Past Surgical History:  Procedure Laterality Date  . AV FISTULA REPAIR  2005   dr deveshwar  . COLONOSCOPY W/ BIOPSIES    . LAPAROSCOPIC PARTIAL COLECTOMY N/A 02/27/2015   Procedure: LAPAROSCOPIC PROXIMAL RIGHT COLECTOMY WITH OMENTUM PEXY;  Surgeon: Rhyse Boston, MD;  Location: [facility];  Service: General;  Laterality: N/A;  Request RNFA  . moles removed     from face       Home Medications    Prior to Admission medications   Medication Sig Start Date End Date Taking? Authorizing Provider  hydrochlorothiazide (HYDRODIURIL) 25 MG tablet Take 12.5 mg by mouth daily.   Yes Historical Provider, MD  ibuprofen (ADVIL,MOTRIN) 600 MG tablet Take 600 mg by mouth every 6 (six) hours as needed.   Yes  Historical Provider, MD  Multiple Vitamin (MULTIVITAMIN) tablet Take 1 tablet by mouth daily.   Yes Historical Provider, MD  tamsulosin (FLOMAX) 0.4 MG CAPS capsule Take 0.4 mg by mouth daily.   Yes Historical Provider, MD  benzonatate (TESSALON) 200 MG capsule Take 1 capsule (200 mg total) by mouth 3 (three) times daily as needed for cough. 08/18/16   Sherlene Shams, MD  levofloxacin (LEVAQUIN) 500 MG tablet Take 1 tablet (500 mg total) by mouth daily. 08/18/16 08/28/16  Sherlene Shams, MD  oseltamivir (TAMIFLU) 75 MG capsule Take 1 capsule (75 mg total) by mouth every 12 (twelve) hours. 08/18/16   Sherlene Shams, MD  predniSONE (DELTASONE) 50 MG tablet Take 1 tablet (50 mg total) by mouth daily. 08/18/16   Sherlene Shams, MD    Family History Family History  Problem Relation Age of Onset  . Colon polyps Mother   . Skin cancer Mother   . Lung cancer Father   . Skin cancer Sister     x 2  . Dementia Mother   . Clotting disorder Mother     Social History Social History  Substance Use Topics  . Smoking status: Former Smoker    Types: Cigarettes    Quit date: 08/18/1988  . Smokeless tobacco: Former Systems developer  Types: Sarina Ser    Quit date: 08/18/1988  . Alcohol use 4.8 - 6.0 oz/week    4 - 5 Glasses of wine, 4 - 5 Standard drinks or equivalent per week     Allergies   Penicillins   Review of Systems Review of Systems  All other systems reviewed and are negative.    Physical Exam Triage Vital Signs ED Triage Vitals  Enc Vitals Group     BP 08/18/16 1205 152/70     Pulse Rate 08/18/16 1205 102     Resp 08/18/16 1205 20     Temp 08/18/16 1205 98.7 F (37.1 C)     Temp Source 08/18/16 1205 Oral     SpO2 08/18/16 1205 98 %     Weight --      Height --      Pain Score 08/18/16 1208 1   Updated Vital Signs BP 152/70 (BP Location: Left Arm)   Pulse 102   Temp 98.7 F (37.1 C) (Oral)   Resp 20   SpO2 98%  Physical Exam  Constitutional: He is oriented to person, place, and time.    Alert, nicely groomed Looks ill but not toxic  HENT:  Head: Atraumatic.  Bilateral TMs are pink flushed, opaque, right is red tinged Marked nasal congestion Throat is red  Eyes:  Conjugate gaze, bilateral conjunctivae are slightly injected, watery  Neck: Neck supple.  Cardiovascular: Regular rhythm.   Heart rate 100s  Pulmonary/Chest: No respiratory distress. He has no wheezes. He has no rales.  Lungs clear, symmetric breath sounds  Abdominal: He exhibits no distension.  Musculoskeletal: Normal range of motion.  Neurological: He is alert and oriented to person, place, and time.  Skin: Skin is warm and dry.  No cyanosis  Nursing note and vitals reviewed.    UC Treatments / Results   Procedures Procedures (including critical care time) None today  Final Clinical Impressions(s) / UC Diagnoses   Final diagnoses:  Influenza-like illness  Acute sinusitis with symptoms > 10 days   Suspect sinusitis with superimposed viral infection like flu.  [facility] [address].  Recheck for persistent fever >100.5, increasing phlegm production/nasal discharge, or if not starting to improve in a few days.  Cough may take a couple weeks to subside.    New Prescriptions New Prescriptions   BENZONATATE (TESSALON) 200 MG CAPSULE    Take 1 capsule (200 mg total) by mouth 3 (three) times daily as needed for cough.   LEVOFLOXACIN (LEVAQUIN) 500 MG TABLET    Take 1 tablet (500 mg total) by mouth daily.   OSELTAMIVIR (TAMIFLU) 75 MG CAPSULE    Take 1 capsule (75 mg total) by mouth every 12 (twelve) hours.   PREDNISONE (DELTASONE) 50 MG TABLET    Take 1 tablet (50 mg total) by mouth daily.     Sherlene Shams, MD 08/19/16 2028

## 2019-08-30 ENCOUNTER — Encounter: Payer: Self-pay | Admitting: Internal Medicine

## 2019-11-07 ENCOUNTER — Encounter: Payer: Self-pay | Admitting: Gastroenterology

## 2019-11-21 ENCOUNTER — Other Ambulatory Visit: Payer: Self-pay

## 2019-11-21 ENCOUNTER — Ambulatory Visit (AMBULATORY_SURGERY_CENTER): Payer: Self-pay

## 2019-11-21 VITALS — Temp 97.1°F | Ht 67.5 in | Wt 185.6 lb

## 2019-11-21 DIAGNOSIS — Z86010 Personal history of colonic polyps: Secondary | ICD-10-CM

## 2019-11-21 DIAGNOSIS — Z01818 Encounter for other preprocedural examination: Secondary | ICD-10-CM

## 2019-11-21 NOTE — Progress Notes (Signed)

## 2019-12-01 ENCOUNTER — Other Ambulatory Visit: Payer: Self-pay | Admitting: Gastroenterology

## 2019-12-01 ENCOUNTER — Ambulatory Visit (INDEPENDENT_AMBULATORY_CARE_PROVIDER_SITE_OTHER): Payer: Medicare Other

## 2019-12-01 ENCOUNTER — Telehealth: Payer: Self-pay | Admitting: Gastroenterology

## 2019-12-01 DIAGNOSIS — Z1159 Encounter for screening for other viral diseases: Secondary | ICD-10-CM

## 2019-12-01 LAB — SARS CORONAVIRUS 2 (TAT 6-24 HRS): SARS Coronavirus 2: NEGATIVE

## 2019-12-01 NOTE — Telephone Encounter (Signed)
Pt is scheduled for a colon 12/06/19.  He stated that he found out today that he has been exposed to covid.  Pt had his covid test today, pending results.

## 2019-12-01 NOTE — Telephone Encounter (Signed)
Dr. Rush Landmark, this patient is scheduled too see you for a colonoscopy on 4/20. He found out that he was exposed to covid today, 4/15. Concerned that covid test from today would not be accurate seeing how it takes a few days for symptoms to appear. Wondering if pt should push procedure date back a few days, and have pt retest prior to appointment? Please advise.

## 2019-12-02 NOTE — Telephone Encounter (Signed)
I agree with his concern. Please have patient test today cancelled if possible. Would reschedule patient for 3-4 weeks from now. Thank you. GM

## 2019-12-02 NOTE — Telephone Encounter (Signed)
Pt is rescheduled to 01/03/20.

## 2019-12-02 NOTE — Telephone Encounter (Signed)
Good Morning, Could one of you help me out with this? Please see notes from below.

## 2019-12-06 ENCOUNTER — Encounter: Payer: 59 | Admitting: Gastroenterology

## 2019-12-30 ENCOUNTER — Other Ambulatory Visit: Payer: Self-pay

## 2019-12-30 ENCOUNTER — Other Ambulatory Visit: Payer: Self-pay | Admitting: Gastroenterology

## 2019-12-30 ENCOUNTER — Ambulatory Visit (INDEPENDENT_AMBULATORY_CARE_PROVIDER_SITE_OTHER): Payer: Medicare Other

## 2019-12-30 DIAGNOSIS — Z1159 Encounter for screening for other viral diseases: Secondary | ICD-10-CM

## 2019-12-30 LAB — SARS CORONAVIRUS 2 (TAT 6-24 HRS): SARS Coronavirus 2: NEGATIVE

## 2020-01-03 ENCOUNTER — Ambulatory Visit (AMBULATORY_SURGERY_CENTER): Payer: Medicare Other | Admitting: Gastroenterology

## 2020-01-03 ENCOUNTER — Other Ambulatory Visit: Payer: Self-pay

## 2020-01-03 ENCOUNTER — Encounter: Payer: Self-pay | Admitting: Gastroenterology

## 2020-01-03 VITALS — BP 120/57 | HR 72 | Temp 96.9°F | Resp 17 | Ht 67.0 in | Wt 185.0 lb

## 2020-01-03 DIAGNOSIS — K635 Polyp of colon: Secondary | ICD-10-CM

## 2020-01-03 DIAGNOSIS — D123 Benign neoplasm of transverse colon: Secondary | ICD-10-CM

## 2020-01-03 DIAGNOSIS — Z86010 Personal history of colonic polyps: Secondary | ICD-10-CM

## 2020-01-03 MED ORDER — CIPROFLOXACIN HCL 500 MG PO TABS: 500.0000 mg | ORAL_TABLET | Freq: Two times a day (BID) | ORAL | 0 refills | Status: AC

## 2020-01-03 MED ORDER — METRONIDAZOLE 500 MG PO TABS: 500.0000 mg | ORAL_TABLET | Freq: Two times a day (BID) | ORAL | 0 refills | Status: AC

## 2020-01-03 MED ORDER — SODIUM CHLORIDE 0.9 % IV SOLN: 500.0000 mL | Freq: Once | INTRAVENOUS | Status: DC

## 2020-01-03 NOTE — Op Note (Signed)
Gregory Castaneda Procedure Date: 01/03/2020 2:43 PM MRN: [id number] Endoscopist: Justice Britain , MD Age: 69 Referring MD:  Date of Birth: October 04, 1950 Gender: Male Account #: [id number] Procedure:                Colonoscopy Indications:              High risk colon cancer surveillance: Personal                            history of colonic polyps, Surveillance: Personal                            history of adenomatous polyps on last colonoscopy 5                            years ago - requiring right hemicolectomy for                            endoscopically unremovable adenoma Medicines:                Monitored Anesthesia Care Procedure:                Pre-Anesthesia Assessment:                           - Prior to the procedure, a History and Physical                            was performed, and patient medications and                            allergies were reviewed. The patient's tolerance of                            previous anesthesia was also reviewed. The risks                            and benefits of the procedure and the sedation                            options and risks were discussed with the patient.                            All questions were answered, and informed consent                            was obtained. Prior Anticoagulants: The patient has                            taken no previous anticoagulant or antiplatelet                            agents except for NSAID medication. ASA Grade  Assessment: II - A patient with mild systemic                            disease. After reviewing the risks and benefits,                            the patient was deemed in satisfactory condition to                            undergo the procedure.                           After obtaining informed consent, the colonoscope                            was passed under direct vision. Throughout the                          procedure, the patient's blood pressure, pulse, and                            oxygen saturations were monitored continuously. The                            Colonoscope was introduced through the anus and                            advanced to the the ileocolonic anastomosis. The                            colonoscopy was performed without difficulty. The                            patient tolerated the procedure. The quality of the                            bowel preparation was adequate. The terminal ileum                            was photographed. Scope In: 2:51:30 PM Scope Out: 3:09:47 PM Scope Withdrawal Time: 0 hours 15 minutes 52 seconds  Total Procedure Duration: 0 hours 18 minutes 17 seconds  Findings:                 Skin tags were found on perianal exam.                           The digital rectal exam findings include                            hemorrhoids. Pertinent negatives include no                            palpable rectal lesions.  There was evidence of a prior end-to-side                            ileo-colonic anastomosis in the ascending colon.                            This was patent and was characterized by healthy                            appearing mucosa. The anastomosis was traversed.                           The neo-terminal ileum appeared normal.                           A 12 mm polyp was found in the transverse colon.                            The polyp was sessile. The polyp was removed with a                            cold snare. Resection and retrieval were complete.                           Many small and large-mouthed diverticula were found                            in the recto-sigmoid colon, sigmoid colon,                            descending colon and transverse colon.                           A single diverticulum with moderate inflammation                            was found in the  sigmoid colon - consistent with                            developing diverticulitis.                           Non-bleeding non-thrombosed external and internal                            hemorrhoids were found during retroflexion, during                            perianal exam and during digital exam. The                            hemorrhoids were Grade II (internal hemorrhoids  that prolapse but reduce spontaneously). Complications:            No immediate complications. Estimated Blood Loss:     Estimated blood loss was minimal. Impression:               - Perianal skin tags and hemorrhoids found on                            perianal/digital exam.                           - Patent end-to-side ileo-colonic anastomosis,                            characterized by healthy appearing mucosa.                           - The examined portion of the ileum was normal.                           - One 12 mm polyp in the transverse colon, removed                            with a cold snare. Resected and retrieved.                           - Diverticulosis in the recto-sigmoid colon, in the                            sigmoid colon, in the descending colon and in the                            transverse colon.                           - Moderate inflammation of a single diverticulum                            concerning for developing diverticulitis.                           - Non-bleeding non-thrombosed external and internal                            hemorrhoids. Recommendation:           - The patient will be observed post-procedure,                            until all discharge criteria are met.                           - Discharge patient to home.                           - Patient has a contact number available for  emergencies. The signs and symptoms of potential                            delayed complications were discussed  with the                            patient. Return to normal activities tomorrow.                            Written discharge instructions were provided to the                            patient.                           - High fiber diet.                           - Use FiberCon 1 tablet PO daily.                           - Ciprofloxacin 500 mg BID x 5-days and Flagyl 500                            mg BID x 5-days to treat possible developing                            diverticulitis.                           - Await pathology results.                           - Continue present medications.                           - Repeat colonoscopy in 3 years for surveillance.                           - The findings and recommendations were discussed                            with the patient. Justice Britain, MD 01/03/2020 3:19:15 PM

## 2020-01-03 NOTE — Progress Notes (Signed)
Pt's states no medical or surgical changes since previsit or office visit. 

## 2020-01-03 NOTE — Progress Notes (Signed)
Called to room to assist during endoscopic procedure.  Patient ID and intended procedure confirmed with present staff. Received instructions for my participation in the procedure from the performing physician.  

## 2020-01-03 NOTE — Patient Instructions (Signed)
Handout on polyps, hemorrhoids, diverticulosis, and high fiber diet given to you today  Await pathology results Use FiberCon 1 tablet daily  Start antibiotics sent to [facility]     YOU HAD AN ENDOSCOPIC PROCEDURE TODAY AT [place]:   Refer to the procedure report that was given to you for any specific questions about what was found during the examination.  If the procedure report does not answer your questions, please call your gastroenterologist to clarify.  If you requested that your care partner not be given the details of your procedure findings, then the procedure report has been included in a sealed envelope for you to review at your convenience later.  YOU SHOULD EXPECT: Some feelings of bloating in the abdomen. Passage of more gas than usual.  Walking can help get rid of the air that was put into your GI tract during the procedure and reduce the bloating. If you had a lower endoscopy (such as a colonoscopy or flexible sigmoidoscopy) you may notice spotting of blood in your stool or on the toilet paper. If you underwent a bowel prep for your procedure, you may not have a normal bowel movement for a few days.  Please Note:  You might notice some irritation and congestion in your nose or some drainage.  This is from the oxygen used during your procedure.  There is no need for concern and it should clear up in a day or so.  SYMPTOMS TO REPORT IMMEDIATELY:   Following lower endoscopy (colonoscopy or flexible sigmoidoscopy):  Excessive amounts of blood in the stool  Significant tenderness or worsening of abdominal pains  Swelling of the abdomen that is new, acute  Fever of 100F or higher  For urgent or emergent issues, a gastroenterologist can be reached at any hour by calling [phone]. Do not use MyChart messaging for urgent concerns.    DIET:  We do recommend a small meal at first, but then you may proceed to your regular diet.  Drink plenty of fluids but you  should avoid alcoholic beverages for 24 hours.  ACTIVITY:  You should plan to take it easy for the rest of today and you should NOT DRIVE or use heavy machinery until tomorrow (because of the sedation medicines used during the test).    FOLLOW UP: Our staff will call the number listed on your records 48-72 hours following your procedure to check on you and address any questions or concerns that you may have regarding the information given to you following your procedure. If we do not reach you, we will leave a message.  We will attempt to reach you two times.  During this call, we will ask if you have developed any symptoms of COVID 19. If you develop any symptoms (ie: fever, flu-like symptoms, shortness of breath, cough etc.) before then, please call [phone].  If you test positive for Covid 19 in the 2 weeks post procedure, please call and report this information to [place].    If any biopsies were taken you will be contacted by phone or by letter within the next 1-3 weeks.  Please call us at [phone] if you have not heard about the biopsies in 3 weeks.    SIGNATURES/CONFIDENTIALITY: You and/or your care partner have signed paperwork which will be entered into your electronic medical record.  These signatures attest to the fact that that the information above on your After Visit Summary has been reviewed and is understood.  Full responsibility  of the confidentiality of this discharge information lies with you and/or your care-partner.

## 2020-01-03 NOTE — Progress Notes (Signed)
Report to PACU, RN, vss, BBS= Clear.  

## 2020-01-05 ENCOUNTER — Telehealth: Payer: Self-pay | Admitting: *Deleted

## 2020-01-05 NOTE — Telephone Encounter (Signed)
  Follow up Call-  Call back number 01/03/2020  Post procedure Call Back phone  # [phone]  Permission to leave phone message Yes  Some recent data might be hidden     Patient questions:  Do you have a fever, pain , or abdominal swelling? No. Pain Score  0 *  Have you tolerated food without any problems? Yes.    Have you been able to return to your normal activities? Yes.    Do you have any questions about your discharge instructions: Diet   No. Medications  No. Follow up visit  No.  Do you have questions or concerns about your Care? No.  Actions: * If pain score is 4 or above: No action needed, pain <4.  1. Have you developed a fever since your procedure? No   2.   Have you had an respiratory symptoms (SOB or cough) since your procedure? no  3.   Have you tested positive for COVID 19 since your procedure no  4.   Have you had any family members/close contacts diagnosed with the COVID 19 since your procedure?  no   If yes to any of these questions please route to Joylene John, RN and Erenest Rasher, RN

## 2020-01-08 ENCOUNTER — Encounter: Payer: Self-pay | Admitting: Gastroenterology

## 2021-12-16 ENCOUNTER — Ambulatory Visit: Payer: Self-pay | Admitting: Podiatry

## 2021-12-23 ENCOUNTER — Ambulatory Visit (INDEPENDENT_AMBULATORY_CARE_PROVIDER_SITE_OTHER): Payer: Medicare Other | Admitting: Podiatry

## 2021-12-23 ENCOUNTER — Encounter: Payer: Self-pay | Admitting: Podiatry

## 2021-12-23 DIAGNOSIS — B351 Tinea unguium: Secondary | ICD-10-CM

## 2021-12-23 DIAGNOSIS — L600 Ingrowing nail: Secondary | ICD-10-CM | POA: Diagnosis not present

## 2021-12-23 NOTE — Progress Notes (Signed)
Subjective:  ? ?Patient ID: Gregory Castaneda, male   DOB: 71 y.o.   MRN: [id number]  ? ?HPI ?Patient presents stating that the nail of the big toe left over right has become thickened and on the left when it actually come partially detached and he is not sure where this is going.  States is not significantly tender but he is concerned about the structure and position of the nailbed.  Patient does not smoke likes to be active ? ? ?Review of Systems  ?All other systems reviewed and are negative. ? ? ?   ?Objective:  ?Physical Exam ?Vitals and nursing note reviewed.  ?Constitutional:   ?   Appearance: He is well-developed.  ?Pulmonary:  ?   Effort: Pulmonary effort is normal.  ?Musculoskeletal:     ?   General: Normal range of motion.  ?Skin: ?   General: Skin is warm.  ?Neurological:  ?   Mental Status: He is alert.  ?  ?Neurovascular status found to be intact muscle strength was found to be adequate range of motion within normal limits.  Patient is noted to have a deformed big toenail left that is partially detached and the right is discolored.  Patient does not have significant pain associated with this and has good digital perfusion well oriented x3 ? ?   ?Assessment:  ?Several different conditions with 1 being damaged left hallux nail with partial detachment and secondarily probable fungal infection ? ?   ?Plan:  ?H&P reviewed both conditions and discussed at length.  At this point the nail was partially debrided and I did discuss possibility for removal in future and that it may be permanent.  Do not recommend treatment for any fungal at this time as I do not think there is pathology associated with ?   ? ? ?

## 2023-02-05 ENCOUNTER — Encounter: Payer: Self-pay | Admitting: Gastroenterology

## 2024-02-05 ENCOUNTER — Encounter: Payer: Self-pay | Admitting: Gastroenterology

## 2024-02-26 ENCOUNTER — Encounter: Payer: Self-pay | Admitting: Gastroenterology

## 2024-02-26 ENCOUNTER — Ambulatory Visit (AMBULATORY_SURGERY_CENTER)

## 2024-02-26 VITALS — Ht 69.0 in | Wt 185.0 lb

## 2024-02-26 DIAGNOSIS — Z860100 Personal history of colon polyps, unspecified: Secondary | ICD-10-CM

## 2024-02-26 MED ORDER — NA SULFATE-K SULFATE-MG SULF 17.5-3.13-1.6 GM/177ML PO SOLN
1.0000 | Freq: Once | ORAL | 0 refills | Status: AC
Start: 2024-02-26 — End: 2024-02-26

## 2024-02-26 NOTE — Progress Notes (Signed)

## 2024-03-11 ENCOUNTER — Ambulatory Visit: Admitting: Gastroenterology

## 2024-03-11 ENCOUNTER — Encounter: Payer: Self-pay | Admitting: Gastroenterology

## 2024-03-11 VITALS — BP 110/53 | HR 72 | Temp 97.5°F | Resp 15 | Ht 69.0 in | Wt 185.0 lb

## 2024-03-11 DIAGNOSIS — Z860100 Personal history of colon polyps, unspecified: Secondary | ICD-10-CM

## 2024-03-11 DIAGNOSIS — D123 Benign neoplasm of transverse colon: Secondary | ICD-10-CM

## 2024-03-11 DIAGNOSIS — Z1211 Encounter for screening for malignant neoplasm of colon: Secondary | ICD-10-CM | POA: Diagnosis not present

## 2024-03-11 DIAGNOSIS — K641 Second degree hemorrhoids: Secondary | ICD-10-CM | POA: Diagnosis not present

## 2024-03-11 DIAGNOSIS — K5730 Diverticulosis of large intestine without perforation or abscess without bleeding: Secondary | ICD-10-CM

## 2024-03-11 MED ORDER — SODIUM CHLORIDE 0.9 % IV SOLN: 500.0000 mL | Freq: Once | INTRAVENOUS | Status: AC

## 2024-03-11 NOTE — Progress Notes (Signed)
To pacu, vss. Report to [email or web address] 

## 2024-03-11 NOTE — Patient Instructions (Signed)
 YOU HAD AN ENDOSCOPIC PROCEDURE TODAY AT THE [facility]:   Refer to the procedure report that was given to you for any specific questions about what was found during the examination.  If the procedure report does not answer your questions, please call your gastroenterologist to clarify.  If you requested that your care partner not be given the details of your procedure findings, then the procedure report has been included in a sealed envelope for you to review at your convenience later.  YOU SHOULD EXPECT: Some feelings of bloating in the abdomen. Passage of more gas than usual.  Walking can help get rid of the air that was put into your GI tract during the procedure and reduce the bloating. If you had a lower endoscopy (such as a colonoscopy or flexible sigmoidoscopy) you may notice spotting of blood in your stool or on the toilet paper. If you underwent a bowel prep for your procedure, you may not have a normal bowel movement for a few days.  Please Note:  You might notice some irritation and congestion in your nose or some drainage.  This is from the oxygen used during your procedure.  There is no need for concern and it should clear up in a day or so.  SYMPTOMS TO REPORT IMMEDIATELY:  Following lower endoscopy (colonoscopy or flexible sigmoidoscopy):  Excessive amounts of blood in the stool  Significant tenderness or worsening of abdominal pains  Swelling of the abdomen that is new, acute  Fever of 100F or higher   For urgent or emergent issues, a gastroenterologist can be reached at any hour by calling [phone]. Do not use MyChart messaging for urgent concerns.    DIET:  We do recommend a small meal at first, but then you may proceed to your regular diet.  Drink plenty of fluids but you should avoid alcoholic beverages for 24 hours. Follow a High Fiber Diet.  MEDICATIONS: Continue present medications. Use FiberCon 1-2 tablets by mouth once daily.  FOLLOW UP: Await  pathology results. Repeat colonoscopy in 5 years for surveillance (not matter pathology as a result of patient's prior history of advanced adenoma requiring resection).  Handouts given to patient: Polyps, Diverticulosis, Hemorrhoids, High Fiber Diet.  Thank you for allowing us  to provide for your healthcare needs today.    ACTIVITY:  You should plan to take it easy for the rest of today and you should NOT DRIVE or use heavy machinery until tomorrow (because of the sedation medicines used during the test).    FOLLOW UP: Our staff will call the number listed on your records the next business day following your procedure.  We will call around 7:15- 8:00 am to check on you and address any questions or concerns that you may have regarding the information given to you following your procedure. If we do not reach you, we will leave a message.     If any biopsies were taken you will be contacted by phone or by letter within the next 1-3 weeks.  Please call us  at [phone] if you have not heard about the biopsies in 3 weeks.    SIGNATURES/CONFIDENTIALITY: You and/or your care partner have signed paperwork which will be entered into your electronic medical record.  These signatures attest to the fact that that the information above on your After Visit Summary has been reviewed and is understood.  Full responsibility of the confidentiality of this discharge information lies with you and/or your care-partner.

## 2024-03-11 NOTE — Progress Notes (Signed)
 GASTROENTEROLOGY PROCEDURE H&P NOTE   Primary Care Physician: [name], MD  HPI: Gregory Castaneda is a 73 y.o. male who presents for Colonoscopy for surveillance in setting of prior advanced adenomas and prior colon resection.  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   Cancer (HCC)    non-malignant   Enlarged prostate    Heart murmur    AS CHILD - NO PROBLEM AS ADULT   History of kidney stones    History of skin cancer    basal cell   Hypertension    Lumbar herniated disc    lumbar with tingling and pain left leg when acts up   Palpitations    infrequently   PONV (postoperative nausea and vomiting)    post  fistula repair   Urgency of urination    Past Surgical History:  Procedure Laterality Date   AV FISTULA REPAIR  2005   dr deveshwar   COLONOSCOPY  09/22/2014   COLONOSCOPY W/ BIOPSIES     LAPAROSCOPIC PARTIAL COLECTOMY N/A 02/27/2015   Procedure: LAPAROSCOPIC PROXIMAL RIGHT COLECTOMY WITH OMENTUM PEXY;  Surgeon: [name], MD;  Location: [facility];  Service: General;  Laterality: N/A;  Request RNFA   moles removed     from face   POLYPECTOMY     PROSTATECTOMY     2024   Current Outpatient Medications  Medication Sig Dispense Refill   Na Sulfate-K Sulfate-Mg Sulfate concentrate (SUPREP) 17.5-3.13-1.6 GM/177ML SOLN SMARTSIG:1 Kit(s) By Mouth Once     benzonatate  (TESSALON ) 200 MG capsule Take 1 capsule (200 mg total) by mouth 3 (three) times daily as needed for cough. (Patient not taking: Reported on 02/26/2024) 30 capsule 1   cyclobenzaprine (FLEXERIL) 5 MG tablet Take one or 2 tablets 3 times a day as needed for muscle spasm     HYDROcodone-acetaminophen  (NORCO/VICODIN) 5-325 MG tablet Take one tablet every 4 hours as needed for pain     ibuprofen (ADVIL,MOTRIN) 600 MG tablet Take 600 mg by mouth every 6 (six) hours  as needed.     losartan (COZAAR) 50 MG tablet Take 50 mg by mouth daily.     Multiple Vitamin (MULTIVITAMIN) tablet Take 1 tablet by mouth daily.     Turmeric (QC TUMERIC COMPLEX) 500 MG CAPS Take 1 tablet by mouth daily.     No current facility-administered medications for this visit.    Current Outpatient Medications:    Na Sulfate-K Sulfate-Mg Sulfate concentrate (SUPREP) 17.5-3.13-1.6 GM/177ML SOLN, SMARTSIG:1 Kit(s) By Mouth Once, Disp: , Rfl:    benzonatate  (TESSALON ) 200 MG capsule, Take 1 capsule (200 mg total) by mouth 3 (three) times daily as needed for cough. (Patient not taking: Reported on 02/26/2024), Disp: 30 capsule, Rfl: 1   cyclobenzaprine (FLEXERIL) 5 MG tablet, Take one or 2 tablets 3 times a day as needed for muscle spasm, Disp: , Rfl:    HYDROcodone-acetaminophen  (NORCO/VICODIN) 5-325 MG tablet, Take one tablet every 4 hours as needed for pain, Disp: , Rfl:    ibuprofen (ADVIL,MOTRIN) 600 MG tablet, Take 600 mg by mouth every 6 (six) hours as needed., Disp: , Rfl:    losartan (COZAAR) 50 MG tablet, Take 50 mg by mouth daily., Disp: , Rfl:    Multiple Vitamin (MULTIVITAMIN) tablet, Take 1 tablet by mouth daily., Disp: , Rfl:  Turmeric (QC TUMERIC COMPLEX) 500 MG CAPS, Take 1 tablet by mouth daily., Disp: , Rfl:  Allergies  Allergen Reactions   Penicillins Swelling and Rash    Rash, feet itching, swelling of mouth   Family History  Problem Relation Age of Onset   Colon polyps Mother    Skin cancer Mother    Dementia Mother    Clotting disorder Mother    Lung cancer Father    Skin cancer Sister        x 2   Colon cancer Paternal Uncle    Esophageal cancer Neg Hx    Stomach cancer Neg Hx    Rectal cancer Neg Hx    Social History   Socioeconomic History   Marital status: Married    Spouse name: Not on file   Number of children: 2   Years of education: Not on file   Highest education level: Not on file  Occupational History   Occupation: self employed   Tobacco Use   Smoking status: Former    Current packs/day: 0.00    Types: Cigarettes    Quit date: 08/18/1988    Years since quitting: 35.5   Smokeless tobacco: Former    Types: Chew    Quit date: 08/18/1988  Vaping Use   Vaping status: Never Used  Substance and Sexual Activity   Alcohol use: Yes    Alcohol/week: 8.0 - 10.0 standard drinks of alcohol    Types: 4 - 5 Glasses of wine, 4 - 5 Standard drinks or equivalent per week   Drug use: No   Sexual activity: Not on file  Other Topics Concern   Not on file  Social History Narrative   Not on file   Social Drivers of Health   Financial Resource Strain: Not on file  Food Insecurity: Low Risk  (03/24/2023)   Received from [facility]   Hunger Vital Sign    Within the past 12 months, you worried that your food would run out before you got money to buy more: Never true    Within the past 12 months, the food you bought just didn't last and you didn't have money to get more. : Never true  Transportation Needs: No Transportation Needs (03/24/2023)   Received from [facility]    In the past 12 months, has lack of reliable transportation kept you from medical appointments, meetings, work or from getting things needed for daily living? : No  Physical Activity: Not on file  Stress: Not on file  Social Connections: Not on file  Intimate Partner Violence: Not on file    Physical Exam: There were no vitals filed for this visit. There is no height or weight on file to calculate BMI. GEN: NAD EYE: Sclerae anicteric ENT: MMM CV: Non-tachycardic GI: Soft, NT/ND NEURO:  Alert & Oriented x 3  Lab Results: No results for input(s): WBC, HGB, HCT, PLT in the last 72 hours. BMET No results for input(s): NA, K, CL, CO2, GLUCOSE, BUN, CREATININE, CALCIUM in the last 72 hours. LFT No results for input(s): PROT, ALBUMIN, AST, ALT, ALKPHOS, BILITOT, BILIDIR, IBILI in the last 72  hours. PT/INR No results for input(s): LABPROT, INR in the last 72 hours.   Impression / Plan: This is a 73 y.o.male who presents for Colonoscopy for surveillance in setting of prior advanced adenomas and prior colon resection.  The risks and benefits of endoscopic evaluation/treatment were discussed with the patient and/or family; these include  but are not limited to the risk of perforation, infection, bleeding, missed lesions, lack of diagnosis, severe illness requiring hospitalization, as well as anesthesia and sedation related illnesses.  The patient's history has been reviewed, patient examined, no change in status, and deemed stable for procedure.  The patient and/or family is agreeable to proceed.    Aloha Finner, MD [facility] Advanced [facility]oscopy Office # [phone]

## 2024-03-11 NOTE — Progress Notes (Signed)
 Called to room to assist during endoscopic procedure.  Patient ID and intended procedure confirmed with present staff. Received instructions for my participation in the procedure from the performing physician.

## 2024-03-11 NOTE — Progress Notes (Signed)
 Pt's states no medical or surgical changes since previsit or office visit.

## 2024-03-11 NOTE — Op Note (Signed)
 [facility] Patient Name: Gregory Castaneda Procedure Date: 03/11/2024 2:49 PM MRN: [id number] Endoscopist: Aloha Finner , MD, [id number] Age: 73 Referring MD:  Date of Birth: 1951/04/19 Gender: Male Account #: [id number] Procedure:                Colonoscopy Indications:              High risk colon cancer surveillance: Personal                            history of adenoma (10 mm or greater in size), High                            risk colon cancer surveillance: Personal history of                            adenoma with high grade dysplasia Medicines:                Monitored Anesthesia Care Procedure:                Pre-Anesthesia Assessment:                           - Prior to the procedure, a History and Physical                            was performed, and patient medications and                            allergies were reviewed. The patient's tolerance of                            previous anesthesia was also reviewed. The risks                            and benefits of the procedure and the sedation                            options and risks were discussed with the patient.                            All questions were answered, and informed consent                            was obtained. Prior Anticoagulants: The patient has                            taken no anticoagulant or antiplatelet agents. ASA                            Grade Assessment: II - A patient with mild systemic                            disease. After reviewing the risks and benefits,  the patient was deemed in satisfactory condition to                            undergo the procedure.                           After obtaining informed consent, the colonoscope                            was passed under direct vision. Throughout the                            procedure, the patient's blood pressure, pulse, and                            oxygen saturations  were monitored continuously. The                            CF [id number] #[id number] was introduced through the anus                            and advanced to the the ileocolonic anastomosis.                            The colonoscopy was performed without difficulty.                            The patient tolerated the procedure. The quality of                            the bowel preparation was adequate. Scope In: 3:11:53 PM Scope Out: 3:23:23 PM Scope Withdrawal Time: 0 hours 9 minutes 11 seconds  Total Procedure Duration: 0 hours 11 minutes 30 seconds  Findings:                 The digital rectal exam findings include                            hemorrhoids. Pertinent negatives include no                            palpable rectal lesions.                           There was evidence of a prior end-to-side                            ileo-colonic anastomosis in the ascending colon.                            This was patent and was characterized by healthy                            appearing mucosa. The anastomosis was traversed.  The neo-terminal ileum appeared normal.                           A 4 mm polyp was found in the transverse colon. The                            polyp was sessile. The polyp was removed with a                            cold snare. Resection and retrieval were complete.                           Multiple small-mouthed diverticula were found in                            the left colon.                           Normal mucosa was found in the entire colon                            otherwise.                           Non-bleeding non-thrombosed internal hemorrhoids                            were found during retroflexion, during perianal                            exam and during digital exam. The hemorrhoids were                            Grade II (internal hemorrhoids that prolapse but                            reduce  spontaneously). Complications:            No immediate complications. Estimated Blood Loss:     Estimated blood loss was minimal. Impression:               - Hemorrhoids found on digital rectal exam.                           - Patent end-to-side ileo-colonic anastomosis,                            characterized by healthy appearing mucosa.                           - The examined portion of the neoterminal ileum was                            normal.                           -  One 4 mm polyp in the transverse colon, removed                            with a cold snare. Resected and retrieved.                           - Diverticulosis in the left colon.                           - Normal mucosa in the entire examined colon                            otherwise.                           - Non-bleeding non-thrombosed internal hemorrhoids. Recommendation:           - The patient will be observed post-procedure,                            until all discharge criteria are met.                           - Discharge patient to home.                           - Patient has a contact number available for                            emergencies. The signs and symptoms of potential                            delayed complications were discussed with the                            patient. Return to normal activities tomorrow.                            Written discharge instructions were provided to the                            patient.                           - High fiber diet.                           - Use FiberCon 1-2 tablets PO daily.                           - Continue present medications.                           - Await pathology results.                           - Repeat colonoscopy in 5  years for surveillance                            (no matter pathology as a result of patient's prior                            history of advanced adenoma requiring resection).                            - The findings and recommendations were discussed                            with the patient.                           - The findings and recommendations were discussed                            with the patient's family. Aloha Finner, MD 03/11/2024 3:28:56 PM

## 2024-03-14 ENCOUNTER — Telehealth: Payer: Self-pay

## 2024-03-14 NOTE — Telephone Encounter (Signed)
  Follow up Call-     03/11/2024    2:51 PM  Call back number  Post procedure Call Back phone  # [phone]  Permission to leave phone message Yes     Patient questions:  Do you have a fever, pain , or abdominal swelling? No. Pain Score  0 *  Have you tolerated food without any problems? Yes.    Have you been able to return to your normal activities? Yes.    Do you have any questions about your discharge instructions: Diet   No. Medications  No. Follow up visit  No.  Do you have questions or concerns about your Care? No.  Actions: * If pain score is 4 or above: No action needed, pain <4.

## 2024-03-16 ENCOUNTER — Ambulatory Visit: Payer: Self-pay | Admitting: Gastroenterology

## 2024-03-16 LAB — SURGICAL PATHOLOGY
